# Patient Record
Sex: Female | Born: 1985
Health system: Southern US, Community
[De-identification: ages and names within clinical notes are randomized; demographics above are authoritative.]

## PROBLEM LIST (undated history)

## (undated) DIAGNOSIS — Z202 Contact with and (suspected) exposure to infections with a predominantly sexual mode of transmission: Secondary | ICD-10-CM

## (undated) DIAGNOSIS — O0930 Supervision of pregnancy with insufficient antenatal care, unspecified trimester: Secondary | ICD-10-CM

## (undated) DIAGNOSIS — IMO0002 Reserved for concepts with insufficient information to code with codable children: Secondary | ICD-10-CM

## (undated) DIAGNOSIS — Z789 Other specified health status: Secondary | ICD-10-CM

## (undated) DIAGNOSIS — A749 Chlamydial infection, unspecified: Secondary | ICD-10-CM

## (undated) DIAGNOSIS — D696 Thrombocytopenia, unspecified: Secondary | ICD-10-CM

## (undated) HISTORY — DX: Reserved for concepts with insufficient information to code with codable children: IMO0002

## (undated) HISTORY — DX: Thrombocytopenia, unspecified: D69.6

## (undated) HISTORY — PX: NO PAST SURGERIES: SHX2092

## (undated) HISTORY — DX: Chlamydial infection, unspecified: A74.9

## (undated) HISTORY — DX: Contact with and (suspected) exposure to infections with a predominantly sexual mode of transmission: Z20.2

## (undated) HISTORY — DX: Supervision of pregnancy with insufficient antenatal care, unspecified trimester: O09.30

---
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
## 2007-01-15 ENCOUNTER — Ambulatory Visit: Payer: Self-pay | Admitting: Family Medicine

## 2007-01-15 ENCOUNTER — Encounter (INDEPENDENT_AMBULATORY_CARE_PROVIDER_SITE_OTHER): Payer: Self-pay | Admitting: Family Medicine

## 2007-01-15 LAB — CONVERTED CEMR LAB
Chlamydia, DNA Probe: NEGATIVE
Glucose, Urine, Semiquant: NEGATIVE
Ketones, urine, test strip: NEGATIVE
Nitrite: NEGATIVE
Specific Gravity, Urine: 1.025
WBC Urine, dipstick: NEGATIVE
pH: 5

## 2007-01-22 ENCOUNTER — Encounter (INDEPENDENT_AMBULATORY_CARE_PROVIDER_SITE_OTHER): Payer: Self-pay | Admitting: Family Medicine

## 2007-02-20 ENCOUNTER — Ambulatory Visit: Payer: Self-pay | Admitting: Family Medicine

## 2007-03-30 ENCOUNTER — Ambulatory Visit: Payer: Self-pay | Admitting: Obstetrics & Gynecology

## 2007-05-14 ENCOUNTER — Ambulatory Visit: Payer: Self-pay | Admitting: Family Medicine

## 2007-07-16 ENCOUNTER — Encounter (INDEPENDENT_AMBULATORY_CARE_PROVIDER_SITE_OTHER): Payer: Self-pay | Admitting: Family Medicine

## 2010-12-22 ENCOUNTER — Other Ambulatory Visit (HOSPITAL_COMMUNITY): Payer: Self-pay | Admitting: Obstetrics and Gynecology

## 2010-12-22 ENCOUNTER — Ambulatory Visit (HOSPITAL_COMMUNITY)
Admission: RE | Admit: 2010-12-22 | Discharge: 2010-12-22 | Disposition: A | Discharge status: Home / Self Care | Payer: Medicaid Other | Source: Ambulatory Visit | Attending: Obstetrics and Gynecology | Admitting: Obstetrics and Gynecology

## 2010-12-22 DIAGNOSIS — Z3689 Encounter for other specified antenatal screening: Secondary | ICD-10-CM

## 2010-12-22 DIAGNOSIS — Z1389 Encounter for screening for other disorder: Secondary | ICD-10-CM | POA: Insufficient documentation

## 2010-12-22 DIAGNOSIS — Z363 Encounter for antenatal screening for malformations: Secondary | ICD-10-CM | POA: Insufficient documentation

## 2010-12-22 DIAGNOSIS — O209 Hemorrhage in early pregnancy, unspecified: Secondary | ICD-10-CM | POA: Insufficient documentation

## 2010-12-22 DIAGNOSIS — O358XX0 Maternal care for other (suspected) fetal abnormality and damage, not applicable or unspecified: Secondary | ICD-10-CM | POA: Insufficient documentation

## 2010-12-22 LAB — OB RESULTS CONSOLE ABO/RH: RH Type: POSITIVE

## 2010-12-22 LAB — OB RESULTS CONSOLE HEPATITIS B SURFACE ANTIGEN: Hepatitis B Surface Ag: NEGATIVE

## 2010-12-22 LAB — OB RESULTS CONSOLE RUBELLA ANTIBODY, IGM: Rubella: IMMUNE

## 2010-12-22 LAB — OB RESULTS CONSOLE HIV ANTIBODY (ROUTINE TESTING): HIV: NONREACTIVE

## 2010-12-27 LAB — CBC
HCT: 34 % — AB (ref 36–46)
Hemoglobin: 12.5 g/dL (ref 12.0–16.0)
Platelets: 161 10*3/uL (ref 150–399)

## 2010-12-27 LAB — OB RESULTS CONSOLE HIV ANTIBODY (ROUTINE TESTING): HIV: NONREACTIVE

## 2010-12-27 LAB — OB RESULTS CONSOLE RPR: RPR: NONREACTIVE

## 2011-01-08 DIAGNOSIS — Z202 Contact with and (suspected) exposure to infections with a predominantly sexual mode of transmission: Secondary | ICD-10-CM

## 2011-01-08 HISTORY — DX: Contact with and (suspected) exposure to infections with a predominantly sexual mode of transmission: Z20.2

## 2011-01-22 ENCOUNTER — Other Ambulatory Visit: Payer: Self-pay

## 2011-01-22 ENCOUNTER — Emergency Department (HOSPITAL_COMMUNITY)
Admission: EM | Admit: 2011-01-22 | Discharge: 2011-01-22 | Disposition: A | Discharge status: Home / Self Care | Payer: Medicaid Other | Attending: Emergency Medicine | Admitting: Emergency Medicine

## 2011-01-22 ENCOUNTER — Encounter: Payer: Self-pay | Admitting: *Deleted

## 2011-01-22 ENCOUNTER — Emergency Department (HOSPITAL_COMMUNITY): Payer: Medicaid Other

## 2011-01-22 DIAGNOSIS — R059 Cough, unspecified: Secondary | ICD-10-CM | POA: Insufficient documentation

## 2011-01-22 DIAGNOSIS — R000 Tachycardia, unspecified: Secondary | ICD-10-CM | POA: Insufficient documentation

## 2011-01-22 DIAGNOSIS — R0602 Shortness of breath: Secondary | ICD-10-CM | POA: Insufficient documentation

## 2011-01-22 DIAGNOSIS — R05 Cough: Secondary | ICD-10-CM | POA: Insufficient documentation

## 2011-01-22 DIAGNOSIS — O99891 Other specified diseases and conditions complicating pregnancy: Secondary | ICD-10-CM | POA: Insufficient documentation

## 2011-01-22 DIAGNOSIS — M549 Dorsalgia, unspecified: Secondary | ICD-10-CM | POA: Insufficient documentation

## 2011-01-22 DIAGNOSIS — R10816 Epigastric abdominal tenderness: Secondary | ICD-10-CM | POA: Insufficient documentation

## 2011-01-22 HISTORY — DX: Other specified health status: Z78.9

## 2011-01-22 LAB — BASIC METABOLIC PANEL
GFR calc Af Amer: >90 mL/min (ref >90)
GFR calc non Af Amer: >90 mL/min (ref >90)
Glucose, Bld: 98 mg/dL (ref 70–99)
Potassium: 3.8 mEq/L (ref 3.5–5.1)
Sodium: 133 mEq/L — ABNORMAL LOW (ref 135–145)

## 2011-01-22 LAB — URINALYSIS, ROUTINE W REFLEX MICROSCOPIC
Ketones, ur: 40 mg/dL — AB
Nitrite: NEGATIVE
Protein, ur: NEGATIVE mg/dL

## 2011-01-22 LAB — CBC
MCH: 31.8 pg (ref 26.0–34.0)
Platelets: 122 10*3/uL — ABNORMAL LOW (ref 150–400)
RBC: 3.58 MIL/uL — ABNORMAL LOW (ref 3.87–5.11)
RDW: 13.4 % (ref 11.5–15.5)
WBC: 5.2 10*3/uL (ref 4.0–10.5)

## 2011-01-22 LAB — DIFFERENTIAL
Basophils Absolute: 0 10*3/uL (ref 0.0–0.1)
Eosinophils Absolute: 0 10*3/uL (ref 0.0–0.7)
Lymphocytes Relative: 11 % — ABNORMAL LOW (ref 12–46)
Lymphs Abs: 0.6 10*3/uL — ABNORMAL LOW (ref 0.7–4.0)
Neutrophils Relative %: 77 % (ref 43–77)

## 2011-01-22 LAB — URINE MICROSCOPIC-ADD ON

## 2011-01-22 MED ORDER — IOHEXOL 350 MG/ML SOLN
100.0000 mL | Freq: Once | INTRAVENOUS | Status: AC | PRN
  Administered 2011-01-22: 100 mL via INTRAVENOUS

## 2011-01-22 MED ORDER — BENZONATATE 100 MG PO CAPS: 100.0000 mg | ORAL_CAPSULE | Freq: Three times a day (TID) | ORAL | Status: AC

## 2011-01-22 NOTE — ED Notes (Signed)
Pt returned from CT testing, family at bedside. Pt denies any shortness of breath or chest pains at this time. Pt does report having lower back pain 8/10, achy pain. Pt denies any vaginal bleeding or discharge at this time. Pt INAD, skin w/d, resp e/u and A/O x3 at this time. Plan of care is updated with verbal understanding. Pt is awaiting CT results for disposition of further care. Will continue to monitor pt .

## 2011-01-22 NOTE — Progress Notes (Signed)
Spoke with Elsie Ra, CNM and gave update on pt status and reason for coming to ED.  Pt not having any OB related symptoms.  Lab & CT results reviewed.  Pt ok to d/c home w/ follow up with OB and no further OB interventions necessary before d/c. Everlean Patterson ,RNC

## 2011-01-22 NOTE — ED Provider Notes (Signed)
History     CSN: [id number] Arrival date & time: 01/22/2011  4:42 PM   First MD Initiated Contact with Patient 01/22/11 1659      Chief Complaint  Patient presents with  . Back Pain    (Consider location/radiation/quality/duration/timing/severity/associated sxs/prior treatment) HPI Comments: Patient is [redacted] weeks pregnant presenting with upper back pain shortness of breath been going on for the past 2 days. She states she has worsening pain with deep breathing and trouble breathing because of the pain. She denies any injury, weakness, numbness or tingling. She denies any chest pain, fever. She has had a dry cough. She denies abdominal pain, vaginal bleeding or discharge. She's been taking Mucinex without relief. Denies any leg pain or swelling. She arrives tachycardic, and mildly tachypneic.  The history is provided by the patient.    Past Medical History  Diagnosis Date  . No pertinent past medical history     Past Surgical History  Procedure Date  . No past surgeries     No family history on file.  History  Substance Use Topics  . Smoking status: Never Smoker   . Smokeless tobacco: Not on file  . Alcohol Use: No    OB History    Grav Para Term Preterm Abortions TAB SAB Ect Mult Living   1               Review of Systems  Constitutional: Negative for activity change and appetite change.  HENT: Negative for congestion and rhinorrhea.   Eyes: Negative for visual disturbance.  Respiratory: Positive for cough and shortness of breath. Negative for chest tightness.   Cardiovascular: Negative for chest pain and palpitations.  Gastrointestinal: Negative for nausea, vomiting and abdominal pain.  Genitourinary: Negative for dysuria and hematuria.  Musculoskeletal: Positive for back pain.  Skin: Negative for rash.  Neurological: Negative for dizziness, weakness and headaches.    Allergies  Review of patient's allergies indicates no known allergies.  Home Medications     Current Outpatient Rx  Name Route Sig Dispense Refill  . GUAIFENESIN 100 MG/5ML PO LIQD Oral Take 200 mg by mouth 3 (three) times daily as needed. For cough/congestion     . BENZONATATE 100 MG PO CAPS Oral Take 1 capsule (100 mg total) by mouth every 8 (eight) hours. 21 capsule 0    BP 94/64  Pulse 111  Temp(Src) 99.9 F (37.7 C) (Oral)  Resp 19  SpO2 100%  Physical Exam  Constitutional: She is oriented to person, place, and time. She appears well-developed. No distress.  HENT:  Head: Normocephalic and atraumatic.  Mouth/Throat: Oropharynx is clear and moist. No oropharyngeal exudate.  Eyes: Conjunctivae are normal. Pupils are equal, round, and reactive to light.  Neck: Normal range of motion. Neck supple.  Cardiovascular: Normal rate, regular rhythm and normal heart sounds.        Tachycardic  Pulmonary/Chest: Effort normal and breath sounds normal. No respiratory distress. She has no wheezes.  Abdominal: Soft. There is no tenderness. There is no rebound and no guarding.       Gravid with minimal epigastric tenderness  Musculoskeletal: Normal range of motion. She exhibits tenderness. She exhibits no edema.       No calf tenderness  Neurological: She is alert and oriented to person, place, and time. No cranial nerve deficit.  Skin: Skin is warm.    ED Course  Procedures (including critical care time)  Labs Reviewed  URINALYSIS, ROUTINE W REFLEX MICROSCOPIC - Abnormal; Notable  for the following:    Color, Urine AMBER (*) BIOCHEMICALS MAY BE AFFECTED BY COLOR   Bilirubin Urine SMALL (*)    Ketones, ur 40 (*)    Leukocytes, UA TRACE (*)    All other components within normal limits  CBC - Abnormal; Notable for the following:    RBC 3.58 (*)    Hemoglobin 11.4 (*)    HCT 32.0 (*)    Platelets 122 (*)    All other components within normal limits  DIFFERENTIAL - Abnormal; Notable for the following:    Lymphocytes Relative 11 (*)    Lymphs Abs 0.6 (*)    All other  components within normal limits  BASIC METABOLIC PANEL - Abnormal; Notable for the following:    Sodium 133 (*)    All other components within normal limits  URINE MICROSCOPIC-ADD ON - Abnormal; Notable for the following:    Squamous Epithelial / LPF MANY (*)    All other components within normal limits   Ct Angio Chest W/cm &/or Wo Cm  01/22/2011  *RADIOLOGY REPORT*  Clinical Data:  Short of breath.  Pregnant  CT ANGIOGRAPHY CHEST WITH CONTRAST  Technique:  Multidetector CT imaging of the chest was performed using the standard protocol during bolus administration of intravenous contrast.  Multiplanar CT image reconstructions including MIPs were obtained to evaluate the vascular anatomy.  Contrast: OMNIPAQUE IOHEXOL 350 MG/ML IV SOLN  Comparison:  None  Findings:  The patient is pregnant and was shielded for the scan.  Negative for pulmonary embolism.  Thoracic aorta is normal.  The lungs are clear without infiltrate or effusion.  No mass or adenopathy is detected.  Review of the MIP images confirms the above findings.  IMPRESSION: Normal  Original Report Authenticated By: Camelia Phenes, M.D.     1. Back pain   2. Cough       MDM  [redacted] weeks pregnant with upper back pain, shortness of breath, tachycardia and cough.  Lungs clear to auscultation.   There is concern for pulmonary embolism given patient's gravid status, tachycardia, and respiratory symptoms.    Risks and benefits of CT angiogram discussed with patient and she agrees to proceed.    Date: 01/22/2011  Rate: 106  Rhythm: sinus tachycardia  QRS Axis: normal  Intervals: normal  ST/T Wave abnormalities: normal  Conduction Disutrbances:none  Narrative Interpretation:   Old EKG Reviewed: none available   CT scan negative for pulmonary embolism. Case discussed with OB rapid response nurse  who discussed the case with on-call center [facility] obstetrics.  patient stable for discharge as she is at 21 weeks and nonviable.    We will treat her cough with tessalon pearls and she can follow up with her OB doctoer.  Glynn Octave, MD 01/23/11 [phone]

## 2011-01-22 NOTE — ED Notes (Signed)
Report received from [facility] RN, pt in CT testing at this time. 1st contact with pt, will continue to monitor pt.

## 2011-01-22 NOTE — ED Notes (Signed)
Pt denies any questions upon discharge. 

## 2011-01-22 NOTE — ED Notes (Signed)
Patient with back pain for about two days.  She states that pain is making her experiencing difficulty breathing.  Respiratory is intact at triage

## 2011-01-22 NOTE — ED Notes (Signed)
OB Rn at bedside with pt.

## 2011-02-08 NOTE — L&D Delivery Note (Signed)
Cesarean Section Procedure Note  Indications: failure to progress: arrest of dilation  Pre-operative Diagnosis: 40 week 0 day pregnancy.  Post-operative Diagnosis: same  Surgeon: Purcell Nails   Assistants: Lavera Guise, CNM  Anesthesia: Epidural anesthesia  Procedure Details   The patient was seen in the Holding Room. The risks, benefits, complications, treatment options, and expected outcomes were discussed with the patient.  The patient concurred with the proposed plan, giving informed consent.  The site of surgery properly noted/marked. The patient was taken to Operating Room # 1, identified as Joanna Shaw and the procedure verified as C-Section Delivery. A Time Out was held and the above information confirmed.  After induction of anesthesia, the patient was draped and prepped in the usual sterile manner. A Pfannenstiel incision was made and carried down through the subcutaneous tissue to the fascia. Fascial incision was made and extended transversely. The fascia was separated from the underlying rectus tissue superiorly and inferiorly. The peritoneum was identified and entered. Peritoneal incision was extended longitudinally. The utero-vesical peritoneal reflection was incised transversely and the bladder flap was bluntly freed from the lower uterine segment. A low transverse uterine incision was made. Infant was delivered from cephalic, DOP presentation with Apgar scores of 8 at one minute and 9 at five minutes. After the umbilical cord was clamped and cut cord blood was obtained for evaluation. The placenta was removed intact and appeared normal. The uterine outline, tubes and ovaries appeared normal. The uterine incision was closed with running locked sutures of 0 Vicryl.  A second imbricating layer was performed. Hemostasis was observed. Lavage was carried out until clear. The fascia was then reapproximated with running sutures of 0 Vicryl. SQ was reapproximated with 2-0  plain.  The skin was reapproximated with 3.0 and Monocryl.  Instrument, sponge, and needle counts were correct prior the abdominal closure and at the conclusion of the case.   Findings: Normal bilateral ovaries and tubes  Estimated Blood Loss:  700 cc         Drains: foley to gravity 250 cc         Total IV Fluids:  1000 ml         Specimens: Placenta         Complications:  None; patient tolerated the procedure well.         Disposition: PACU - hemodynamically stable.         Condition: stable  Attending Attestation: I was present and scrubbed for the entire procedure.

## 2011-04-06 ENCOUNTER — Encounter (INDEPENDENT_AMBULATORY_CARE_PROVIDER_SITE_OTHER): Payer: Medicaid Other | Admitting: Obstetrics and Gynecology

## 2011-04-06 DIAGNOSIS — Z331 Pregnant state, incidental: Secondary | ICD-10-CM

## 2011-04-20 ENCOUNTER — Encounter (INDEPENDENT_AMBULATORY_CARE_PROVIDER_SITE_OTHER): Payer: Medicaid Other | Admitting: Registered Nurse

## 2011-04-20 DIAGNOSIS — Z331 Pregnant state, incidental: Secondary | ICD-10-CM

## 2011-05-04 ENCOUNTER — Encounter (INDEPENDENT_AMBULATORY_CARE_PROVIDER_SITE_OTHER): Payer: Medicaid Other | Admitting: Obstetrics and Gynecology

## 2011-05-04 DIAGNOSIS — Z3480 Encounter for supervision of other normal pregnancy, unspecified trimester: Secondary | ICD-10-CM

## 2011-05-11 ENCOUNTER — Encounter (INDEPENDENT_AMBULATORY_CARE_PROVIDER_SITE_OTHER): Payer: Medicaid Other | Admitting: Obstetrics and Gynecology

## 2011-05-11 DIAGNOSIS — Z331 Pregnant state, incidental: Secondary | ICD-10-CM

## 2011-05-16 ENCOUNTER — Encounter: Payer: Medicaid Other | Admitting: Obstetrics and Gynecology

## 2011-05-17 DIAGNOSIS — D696 Thrombocytopenia, unspecified: Secondary | ICD-10-CM

## 2011-05-17 DIAGNOSIS — O0930 Supervision of pregnancy with insufficient antenatal care, unspecified trimester: Secondary | ICD-10-CM | POA: Insufficient documentation

## 2011-05-18 ENCOUNTER — Ambulatory Visit (INDEPENDENT_AMBULATORY_CARE_PROVIDER_SITE_OTHER): Payer: Medicaid Other | Admitting: Obstetrics and Gynecology

## 2011-05-18 ENCOUNTER — Encounter: Payer: Self-pay | Admitting: Obstetrics and Gynecology

## 2011-05-18 VITALS — BP 108/68 | Wt 155.0 lb

## 2011-05-18 DIAGNOSIS — Z202 Contact with and (suspected) exposure to infections with a predominantly sexual mode of transmission: Secondary | ICD-10-CM | POA: Insufficient documentation

## 2011-05-18 DIAGNOSIS — O26899 Other specified pregnancy related conditions, unspecified trimester: Secondary | ICD-10-CM

## 2011-05-18 DIAGNOSIS — R8789 Other abnormal findings in specimens from female genital organs: Secondary | ICD-10-CM

## 2011-05-18 DIAGNOSIS — B977 Papillomavirus as the cause of diseases classified elsewhere: Secondary | ICD-10-CM

## 2011-05-18 DIAGNOSIS — M545 Low back pain: Secondary | ICD-10-CM

## 2011-05-18 DIAGNOSIS — O0930 Supervision of pregnancy with insufficient antenatal care, unspecified trimester: Secondary | ICD-10-CM | POA: Insufficient documentation

## 2011-05-18 DIAGNOSIS — N898 Other specified noninflammatory disorders of vagina: Secondary | ICD-10-CM

## 2011-05-18 DIAGNOSIS — D696 Thrombocytopenia, unspecified: Secondary | ICD-10-CM | POA: Insufficient documentation

## 2011-05-18 LAB — POCT URINALYSIS DIP (MANUAL ENTRY)
Glucose, UA: NEGATIVE
Nitrite, UA: NEGATIVE
Protein Ur, POC: NEGATIVE
Spec Grav, UA: 1.025
Urobilinogen, UA: 1
pH, UA: 5

## 2011-05-18 LAB — POCT WET PREP (WET MOUNT)
Clue Cells Wet Prep Whiff POC: NEGATIVE
Trichomonas Wet Prep HPF POC: NEGATIVE

## 2011-05-18 MED ORDER — CLOTRIMAZOLE-BETAMETHASONE 1-0.05 % EX CREA: TOPICAL_CREAM | Freq: Every day | CUTANEOUS | Status: DC

## 2011-05-18 NOTE — Progress Notes (Signed)
2+ leuks , urobilinogen=1 trace blood. All else nl on long dip UA. Pt states abnl d/c with itching.Marland KitchenMarland KitchenYellowish d/c. Alvino Chapel

## 2011-05-18 NOTE — Progress Notes (Signed)
No complaints except ?yeast infection but denies LOF Spec yellowish d/c Wet prep ph 5.0, neg whiff, neg Reviewed GBS neg  Lotrisone secondary to vulvitis FKCs and Labor Precautions RTO 1 wk

## 2011-05-25 ENCOUNTER — Encounter: Payer: Self-pay | Admitting: Obstetrics and Gynecology

## 2011-05-25 ENCOUNTER — Ambulatory Visit (INDEPENDENT_AMBULATORY_CARE_PROVIDER_SITE_OTHER): Payer: Medicaid Other | Admitting: Obstetrics and Gynecology

## 2011-05-25 VITALS — BP 104/64 | Ht 67.0 in | Wt 159.0 lb

## 2011-05-25 DIAGNOSIS — Z3400 Encounter for supervision of normal first pregnancy, unspecified trimester: Secondary | ICD-10-CM

## 2011-05-25 DIAGNOSIS — N949 Unspecified condition associated with female genital organs and menstrual cycle: Secondary | ICD-10-CM

## 2011-05-25 LAB — POCT URINALYSIS DIPSTICK
Glucose, UA: NEGATIVE
Ketones, UA: NEGATIVE
Spec Grav, UA: 1.01

## 2011-05-25 NOTE — Patient Instructions (Signed)
Letter for pt to begin maternity leave today

## 2011-05-25 NOTE — Progress Notes (Signed)
Pt. Stated having some pain/pressure in vagina area. Pt want cervix check today .

## 2011-05-26 ENCOUNTER — Encounter: Payer: Self-pay | Admitting: Obstetrics and Gynecology

## 2011-05-26 NOTE — Progress Notes (Signed)
Doing well 

## 2011-05-29 ENCOUNTER — Inpatient Hospital Stay (HOSPITAL_COMMUNITY)
Admission: AD | Admit: 2011-05-29 | Discharge: 2011-06-01 | DRG: 766 | Disposition: A | Discharge status: Home / Self Care | Payer: Medicaid Other | Source: Ambulatory Visit | Attending: Obstetrics and Gynecology | Admitting: Obstetrics and Gynecology

## 2011-05-29 ENCOUNTER — Encounter (HOSPITAL_COMMUNITY): Payer: Self-pay | Admitting: Anesthesiology

## 2011-05-29 ENCOUNTER — Inpatient Hospital Stay (HOSPITAL_COMMUNITY): Payer: Medicaid Other | Admitting: Anesthesiology

## 2011-05-29 ENCOUNTER — Encounter (HOSPITAL_COMMUNITY): Payer: Self-pay | Admitting: *Deleted

## 2011-05-29 ENCOUNTER — Encounter (HOSPITAL_COMMUNITY)
Admission: AD | Disposition: A | Discharge status: Home / Self Care | Payer: Self-pay | Source: Ambulatory Visit | Attending: Obstetrics and Gynecology

## 2011-05-29 DIAGNOSIS — IMO0001 Reserved for inherently not codable concepts without codable children: Secondary | ICD-10-CM

## 2011-05-29 DIAGNOSIS — O9903 Anemia complicating the puerperium: Secondary | ICD-10-CM | POA: Diagnosis not present

## 2011-05-29 DIAGNOSIS — D649 Anemia, unspecified: Secondary | ICD-10-CM | POA: Diagnosis not present

## 2011-05-29 DIAGNOSIS — O324XX0 Maternal care for high head at term, not applicable or unspecified: Secondary | ICD-10-CM

## 2011-05-29 LAB — CBC
HCT: 28.2 % — ABNORMAL LOW (ref 36.0–46.0)
Hemoglobin: 10.4 g/dL — ABNORMAL LOW (ref 12.0–15.0)
MCH: 28.2 pg (ref 26.0–34.0)
MCHC: 33.3 g/dL (ref 30.0–36.0)
MCV: 85.7 fL (ref 78.0–100.0)
Platelets: 129 10*3/uL — ABNORMAL LOW (ref 150–400)
RBC: 3.69 MIL/uL — ABNORMAL LOW (ref 3.87–5.11)
RDW: 14.2 % (ref 11.5–15.5)
WBC: 5.6 10*3/uL (ref 4.0–10.5)

## 2011-05-29 LAB — RPR: RPR Ser Ql: NONREACTIVE

## 2011-05-29 SURGERY — Surgical Case
Anesthesia: Epidural | Site: Abdomen | Wound class: Clean Contaminated

## 2011-05-29 MED ORDER — LACTATED RINGERS IV SOLN
20.0000 [IU] | INTRAVENOUS | Status: DC | PRN
  Administered 2011-05-29: 20 [IU] via INTRAVENOUS

## 2011-05-29 MED ORDER — CITRIC ACID-SODIUM CITRATE 334-500 MG/5ML PO SOLN
30.0000 mL | ORAL | Status: DC | PRN
  Administered 2011-05-29: 30 mL via ORAL
  Filled 2011-05-29: qty 15

## 2011-05-29 MED ORDER — EPHEDRINE 5 MG/ML INJ
10.0000 mg | INTRAVENOUS | Status: DC | PRN
  Filled 2011-05-29: qty 4

## 2011-05-29 MED ORDER — FENTANYL CITRATE 0.05 MG/ML IJ SOLN
INTRAMUSCULAR | Status: AC
  Filled 2011-05-29: qty 2

## 2011-05-29 MED ORDER — TERBUTALINE SULFATE 1 MG/ML IJ SOLN: 0.2500 mg | Freq: Once | INTRAMUSCULAR | Status: DC | PRN

## 2011-05-29 MED ORDER — OXYTOCIN 20 UNITS IN LACTATED RINGERS INFUSION - SIMPLE: 1.0000 m[IU]/min | INTRAVENOUS | Status: DC

## 2011-05-29 MED ORDER — KETOROLAC TROMETHAMINE 60 MG/2ML IM SOLN: 60.0000 mg | Freq: Once | INTRAMUSCULAR | Status: DC | PRN

## 2011-05-29 MED ORDER — KETOROLAC TROMETHAMINE 30 MG/ML IJ SOLN: 30.0000 mg | Freq: Four times a day (QID) | INTRAMUSCULAR | Status: DC | PRN

## 2011-05-29 MED ORDER — LACTATED RINGERS IV SOLN
INTRAVENOUS | Status: DC | PRN
  Administered 2011-05-29 (×2): via INTRAVENOUS

## 2011-05-29 MED ORDER — MORPHINE SULFATE (PF) 0.5 MG/ML IJ SOLN
INTRAMUSCULAR | Status: DC | PRN
  Administered 2011-05-29: 1 mg via INTRAVENOUS

## 2011-05-29 MED ORDER — MORPHINE SULFATE 0.5 MG/ML IJ SOLN
INTRAMUSCULAR | Status: AC
  Filled 2011-05-29: qty 10

## 2011-05-29 MED ORDER — LIDOCAINE HCL (PF) 1 % IJ SOLN
INTRAMUSCULAR | Status: DC | PRN
  Administered 2011-05-29 (×3): 4 mL

## 2011-05-29 MED ORDER — PHENYLEPHRINE 40 MCG/ML (10ML) SYRINGE FOR IV PUSH (FOR BLOOD PRESSURE SUPPORT)
80.0000 ug | PREFILLED_SYRINGE | INTRAVENOUS | Status: DC | PRN
  Filled 2011-05-29: qty 5

## 2011-05-29 MED ORDER — LIDOCAINE HCL (PF) 1 % IJ SOLN: 30.0000 mL | INTRAMUSCULAR | Status: DC | PRN

## 2011-05-29 MED ORDER — ONDANSETRON HCL 4 MG/2ML IJ SOLN
4.0000 mg | Freq: Four times a day (QID) | INTRAMUSCULAR | Status: DC | PRN
  Administered 2011-05-29: 4 mg via INTRAVENOUS
  Filled 2011-05-29: qty 2

## 2011-05-29 MED ORDER — HYDROXYZINE HCL 50 MG/ML IM SOLN: 50.0000 mg | Freq: Four times a day (QID) | INTRAMUSCULAR | Status: DC | PRN

## 2011-05-29 MED ORDER — ONDANSETRON HCL 4 MG/2ML IJ SOLN
INTRAMUSCULAR | Status: DC | PRN
  Administered 2011-05-29: 4 mg via INTRAVENOUS

## 2011-05-29 MED ORDER — DIPHENHYDRAMINE HCL 50 MG/ML IJ SOLN: 12.5000 mg | INTRAMUSCULAR | Status: DC | PRN

## 2011-05-29 MED ORDER — OXYTOCIN 20 UNITS IN LACTATED RINGERS INFUSION - SIMPLE
1.0000 m[IU]/min | INTRAVENOUS | Status: DC
  Administered 2011-05-29: 3 m[IU]/min via INTRAVENOUS
  Administered 2011-05-29: 2 m[IU]/min via INTRAVENOUS
  Filled 2011-05-29: qty 1000

## 2011-05-29 MED ORDER — FENTANYL CITRATE 0.05 MG/ML IJ SOLN
25.0000 ug | INTRAMUSCULAR | Status: DC | PRN
  Administered 2011-05-29 (×2): 25 ug via INTRAVENOUS

## 2011-05-29 MED ORDER — IBUPROFEN 600 MG PO TABS: 600.0000 mg | ORAL_TABLET | Freq: Four times a day (QID) | ORAL | Status: DC | PRN

## 2011-05-29 MED ORDER — LACTATED RINGERS IV SOLN: 500.0000 mL | Freq: Once | INTRAVENOUS | Status: DC

## 2011-05-29 MED ORDER — OXYTOCIN 20 UNITS IN LACTATED RINGERS INFUSION - SIMPLE: 125.0000 mL/h | Freq: Once | INTRAVENOUS | Status: DC

## 2011-05-29 MED ORDER — MEPERIDINE HCL 25 MG/ML IJ SOLN
INTRAMUSCULAR | Status: AC
  Filled 2011-05-29: qty 1

## 2011-05-29 MED ORDER — MORPHINE SULFATE (PF) 0.5 MG/ML IJ SOLN
INTRAMUSCULAR | Status: DC | PRN
  Administered 2011-05-29: 4 mg via EPIDURAL

## 2011-05-29 MED ORDER — BUTORPHANOL TARTRATE 2 MG/ML IJ SOLN: 1.0000 mg | INTRAMUSCULAR | Status: DC | PRN

## 2011-05-29 MED ORDER — SCOPOLAMINE 1 MG/3DAYS TD PT72
1.0000 | MEDICATED_PATCH | Freq: Once | TRANSDERMAL | Status: DC
  Administered 2011-05-29: 1.5 mg via TRANSDERMAL

## 2011-05-29 MED ORDER — OXYCODONE-ACETAMINOPHEN 5-325 MG PO TABS: 1.0000 | ORAL_TABLET | ORAL | Status: DC | PRN

## 2011-05-29 MED ORDER — EPHEDRINE 5 MG/ML INJ: 10.0000 mg | INTRAVENOUS | Status: DC | PRN

## 2011-05-29 MED ORDER — FENTANYL 2.5 MCG/ML BUPIVACAINE 1/10 % EPIDURAL INFUSION (WH - ANES)
14.0000 mL/h | INTRAMUSCULAR | Status: DC
  Administered 2011-05-29 (×4): 14 mL/h via EPIDURAL
  Filled 2011-05-29 (×4): qty 60

## 2011-05-29 MED ORDER — LACTATED RINGERS IV SOLN
INTRAVENOUS | Status: DC
  Administered 2011-05-29: 200 mL via INTRAVENOUS
  Administered 2011-05-29 (×2): via INTRAVENOUS

## 2011-05-29 MED ORDER — LACTATED RINGERS IV SOLN
500.0000 mL | INTRAVENOUS | Status: DC | PRN
  Administered 2011-05-29: 1000 mL via INTRAVENOUS

## 2011-05-29 MED ORDER — FENTANYL CITRATE 0.05 MG/ML IJ SOLN
INTRAMUSCULAR | Status: AC
  Administered 2011-05-29: 25 ug via INTRAVENOUS
  Filled 2011-05-29: qty 2

## 2011-05-29 MED ORDER — ACETAMINOPHEN 325 MG PO TABS: 650.0000 mg | ORAL_TABLET | ORAL | Status: DC | PRN

## 2011-05-29 MED ORDER — MEPERIDINE HCL 25 MG/ML IJ SOLN
INTRAMUSCULAR | Status: DC | PRN
  Administered 2011-05-29: 25 mg via INTRAVENOUS

## 2011-05-29 MED ORDER — ONDANSETRON HCL 4 MG/2ML IJ SOLN
INTRAMUSCULAR | Status: AC
  Filled 2011-05-29: qty 2

## 2011-05-29 MED ORDER — SODIUM BICARBONATE 8.4 % IV SOLN
INTRAVENOUS | Status: DC | PRN
  Administered 2011-05-29: 5 mL via EPIDURAL

## 2011-05-29 MED ORDER — 0.9 % SODIUM CHLORIDE (POUR BTL) OPTIME
TOPICAL | Status: DC | PRN
  Administered 2011-05-29 (×2): 200 mL

## 2011-05-29 MED ORDER — SCOPOLAMINE 1 MG/3DAYS TD PT72
MEDICATED_PATCH | TRANSDERMAL | Status: AC
  Filled 2011-05-29: qty 1

## 2011-05-29 MED ORDER — FENTANYL CITRATE 0.05 MG/ML IJ SOLN
INTRAMUSCULAR | Status: DC | PRN
  Administered 2011-05-29 (×2): 50 ug via INTRAVENOUS

## 2011-05-29 MED ORDER — PHENYLEPHRINE 40 MCG/ML (10ML) SYRINGE FOR IV PUSH (FOR BLOOD PRESSURE SUPPORT): 80.0000 ug | PREFILLED_SYRINGE | INTRAVENOUS | Status: DC | PRN

## 2011-05-29 MED ORDER — PHENYLEPHRINE HCL 10 MG/ML IJ SOLN
INTRAMUSCULAR | Status: DC | PRN
  Administered 2011-05-29: 40 ug via INTRAVENOUS
  Administered 2011-05-29: 80 ug via INTRAVENOUS
  Administered 2011-05-29: 40 ug via INTRAVENOUS
  Administered 2011-05-29 (×3): 80 ug via INTRAVENOUS

## 2011-05-29 MED ORDER — MEPERIDINE HCL 25 MG/ML IJ SOLN: 6.2500 mg | INTRAMUSCULAR | Status: DC | PRN

## 2011-05-29 MED ORDER — PHENYLEPHRINE 40 MCG/ML (10ML) SYRINGE FOR IV PUSH (FOR BLOOD PRESSURE SUPPORT)
PREFILLED_SYRINGE | INTRAVENOUS | Status: AC
  Filled 2011-05-29: qty 10

## 2011-05-29 MED ORDER — OXYTOCIN BOLUS FROM INFUSION
500.0000 mL | Freq: Once | INTRAVENOUS | Status: DC
  Filled 2011-05-29: qty 500

## 2011-05-29 MED ORDER — FLEET ENEMA 7-19 GM/118ML RE ENEM: 1.0000 | ENEMA | RECTAL | Status: DC | PRN

## 2011-05-29 MED ORDER — OXYTOCIN 10 UNIT/ML IJ SOLN
INTRAMUSCULAR | Status: AC
  Filled 2011-05-29: qty 4

## 2011-05-29 MED ORDER — HYDROXYZINE HCL 50 MG PO TABS
50.0000 mg | ORAL_TABLET | Freq: Four times a day (QID) | ORAL | Status: DC | PRN
  Filled 2011-05-29: qty 1

## 2011-05-29 MED ORDER — CEFAZOLIN SODIUM-DEXTROSE 2-3 GM-% IV SOLR
2.0000 g | Freq: Once | INTRAVENOUS | Status: AC
  Administered 2011-05-29: 2 g via INTRAVENOUS
  Filled 2011-05-29: qty 50

## 2011-05-29 SURGICAL SUPPLY — 36 items
APL SKNCLS STERI-STRIP NONHPOA (GAUZE/BANDAGES/DRESSINGS) ×1
BENZOIN TINCTURE PRP APPL 2/3 (GAUZE/BANDAGES/DRESSINGS) ×2 IMPLANT
CHLORAPREP W/TINT 26ML (MISCELLANEOUS) ×2 IMPLANT
CLOTH BEACON ORANGE TIMEOUT ST (SAFETY) ×2 IMPLANT
CONTAINER PREFILL 10% NBF 15ML (MISCELLANEOUS) IMPLANT
DRESSING TELFA 8X3 (GAUZE/BANDAGES/DRESSINGS) ×1 IMPLANT
ELECT REM PT RETURN 9FT ADLT (ELECTROSURGICAL) ×2
ELECTRODE REM PT RTRN 9FT ADLT (ELECTROSURGICAL) ×1 IMPLANT
EXTRACTOR VACUUM M CUP 4 TUBE (SUCTIONS) IMPLANT
GAUZE SPONGE 4X4 12PLY STRL LF (GAUZE/BANDAGES/DRESSINGS) ×1 IMPLANT
GLOVE BIO SURGEON STRL SZ7.5 (GLOVE) ×4 IMPLANT
GLOVE BIOGEL PI IND STRL 7.5 (GLOVE) ×1 IMPLANT
GLOVE BIOGEL PI INDICATOR 7.5 (GLOVE) ×1
GOWN PREVENTION PLUS LG XLONG (DISPOSABLE) ×6 IMPLANT
KIT ABG SYR 3ML LUER SLIP (SYRINGE) IMPLANT
NDL HYPO 25X5/8 SAFETYGLIDE (NEEDLE) IMPLANT
NEEDLE HYPO 22GX1.5 SAFETY (NEEDLE) IMPLANT
NEEDLE HYPO 25X5/8 SAFETYGLIDE (NEEDLE) IMPLANT
NS IRRIG 1000ML POUR BTL (IV SOLUTION) ×2 IMPLANT
PACK C SECTION WH (CUSTOM PROCEDURE TRAY) ×2 IMPLANT
PAD ABD 7.5X8 STRL (GAUZE/BANDAGES/DRESSINGS) ×1 IMPLANT
RETRACTOR WND ALEXIS 25 LRG (MISCELLANEOUS) ×1 IMPLANT
RTRCTR WOUND ALEXIS 25CM LRG (MISCELLANEOUS) ×2
SLEEVE SCD COMPRESS KNEE MED (MISCELLANEOUS) ×2 IMPLANT
STRIP CLOSURE SKIN 1/2X4 (GAUZE/BANDAGES/DRESSINGS) ×2 IMPLANT
SUT CHROMIC 2 0 CT 1 (SUTURE) ×2 IMPLANT
SUT MNCRL AB 3-0 PS2 27 (SUTURE) ×2 IMPLANT
SUT PLAIN 0 NONE (SUTURE) IMPLANT
SUT PLAIN 2 0 XLH (SUTURE) ×2 IMPLANT
SUT VIC AB 0 CT1 36 (SUTURE) ×2 IMPLANT
SUT VIC AB 0 CTX 36 (SUTURE) ×6
SUT VIC AB 0 CTX36XBRD ANBCTRL (SUTURE) ×3 IMPLANT
SYR CONTROL 10ML LL (SYRINGE) IMPLANT
TOWEL OR 17X24 6PK STRL BLUE (TOWEL DISPOSABLE) ×4 IMPLANT
TRAY FOLEY CATH 14FR (SET/KITS/TRAYS/PACK) IMPLANT
WATER STERILE IRR 1000ML POUR (IV SOLUTION) ×2 IMPLANT

## 2011-05-29 NOTE — Anesthesia Procedure Notes (Signed)

## 2011-05-29 NOTE — Progress Notes (Signed)
Irregularity noted FHR at intervals. No particular pattern

## 2011-05-29 NOTE — Anesthesia Postprocedure Evaluation (Signed)
Anesthesia Post Note  Patient: Joanna Shaw  Procedure(s) Performed: Procedure(s) (LRB): CESAREAN SECTION (N/A)  Anesthesia type: Epidural  Patient location: PACU  Post pain: Pain level controlled  Post assessment: Post-op Vital signs reviewed  Last Vitals:  Filed Vitals:   05/29/11 2230  BP: 111/63  Pulse: 71  Temp: 37.4 C  Resp: 17    Post vital signs: stable  Level of consciousness: awake  Complications: No apparent anesthesia complications

## 2011-05-29 NOTE — Progress Notes (Addendum)
Comfortable, agrees to scalp electrode O VSS T 99     Fhts 150 late decels resolved after position change    abd soft between uc    uc adequate 240 MVU    Vag unchanged A no cervical change with adequate uc, rising baseline P update Dr. Su Hilt now per telephone.  addendum: Dr. Su Hilt updated on pt status as written and pt agrees to cesarean section, discussed risks bleeding, infection, injury to bowel or bladder,  Lavera Guise, CNM

## 2011-05-29 NOTE — Progress Notes (Signed)
Comfortable with epidural O VSS      fhts category 1      abd soft between uc      uc q 2-3 adequate but 1400-1500 coupling at times      Vag 6-7 90 -1 VTX no capit A  Adequate MVU without cervical change P will updated Dr. Su Hilt, repositioned to squat position. Lavera Guise, CNM

## 2011-05-29 NOTE — Progress Notes (Signed)
Discussed 1600 progress note with Dr. Su Hilt, continue care. Joanna Shaw, CNM

## 2011-05-29 NOTE — Progress Notes (Signed)
To 167 via w/c

## 2011-05-29 NOTE — OR Nursing (Signed)
Fundal massage by  DLWegner RN 

## 2011-05-29 NOTE — Anesthesia Preprocedure Evaluation (Addendum)

## 2011-05-29 NOTE — MAU Note (Signed)
ctxs started at 0212. Having some bloody show and watery d/c.

## 2011-05-29 NOTE — MAU Note (Signed)
Eustace Pen CNM discussing plan of care and pain management with pt

## 2011-05-29 NOTE — Progress Notes (Signed)
Joanna Shaw is a 26 y.o. G1P0 at [redacted]w[redacted]d.  Subjective: Comfortable w/ epidural  Objective: BP 109/66  Pulse 81  Temp(Src) 98.4 F (36.9 C) (Oral)  Resp 18  Ht 5\' 6"  (1.676 m)  Wt 73.483 kg (162 lb)  BMI 26.15 kg/m2  LMP 08/22/2010      FHT:  FHR: 130 bpm, variability: moderate,  accelerations:  Present,  decelerations:  Absent UC:   irregular, every 2-7 minutes, moderate to strong SVE:   Dilation: 6 Effacement (%): 90 Station: -1 Exam by:: [employer]: Lab Results  Component Value Date   WBC 5.6 05/29/2011   HGB 10.4* 05/29/2011   HCT 31.7* 05/29/2011   MCV 85.9 05/29/2011   PLT 129* 05/29/2011    Assessment / Plan: Protracted active phase  Labor: Progressing slowly, but w/ inadequate contraction pattern. If minimal change at next exam, will start pitocin and consider IUPC. Preeclampsia:  NA Fetal Wellbeing:  Category I Pain Control:  Epidural I/D:  n/a Anticipated MOD:  NSVD  Thierno Hun 05/29/2011, 7:31 AM

## 2011-05-29 NOTE — Progress Notes (Signed)
Joanna Shaw is a 26 y.o. G1P0 at [redacted]w[redacted]d.  Subjective: abd cramping  Objective: BP 128/79  Pulse 80  Temp(Src) 98.6 F (37 C) (Oral)  Resp 18  Ht 5\' 6"  (1.676 m)  Wt 73.483 kg (162 lb)  BMI 26.15 kg/m2  SpO2 100%  LMP 08/22/2010   Total I/O In: 1000 [Other:1000] Out: -   FHT:  FHR: 130 bpm, variability: moderate,  accelerations:  Present,  decelerations:  Present early decels UC:   regular, every 2-4 minutes on 10 MilliUnits/min pitocin. MVU's 120-240 SVE:   Dilation: 7 Effacement (%): 90 Station: +1 Exam by:: [place]  Labs:  Assessment / Plan: Augmentation of labor, progressing slowly w/borderline inadequate labor  Labor: Progressing slowly in pitocin. Continue to increase pitocin Preeclampsia:  NA Fetal Wellbeing:  Category I Pain Control:  Epidural I/D:  n/a Anticipated MOD:  NSVD Dr. Su Hilt updated. Aware of slow progress, interventions.  Destyne Goodreau 05/29/2011, 2:30 PM

## 2011-05-29 NOTE — Transfer of Care (Signed)
Immediate Anesthesia Transfer of Care Note  Patient: Joanna Shaw  Procedure(s) Performed: Procedure(s) (LRB): CESAREAN SECTION (N/A)  Patient Location: PACU  Anesthesia Type: Epidural  Level of Consciousness: awake, alert  and oriented  Airway & Oxygen Therapy: Patient Spontanous Breathing  Post-op Assessment: Report given to PACU RN and Post -op Vital signs reviewed and stable  Post vital signs: stable  Complications: No apparent anesthesia complications

## 2011-05-29 NOTE — Progress Notes (Signed)
Joanna Shaw is a 26 y.o. G1P0 at [redacted]w[redacted]d.  Subjective: Comfortable w/ epidural  Objective: BP 106/41  Pulse 73  Temp(Src) 98.6 F (37 C) (Oral)  Resp 18  Ht 5\' 6"  (1.676 m)  Wt 73.483 kg (162 lb)  BMI 26.15 kg/m2  SpO2 100%  LMP 08/22/2010   Total I/O In: 1000 [Other:1000] Out: -   FHT:  FHR: 135 bpm, variability: moderate,  accelerations:  Present,  decelerations:  Absent UC:   irregular, every 2-5 minutes. MVU's 80-13 on 5 milliUnits/min pitocin SVE:   Dilation: 6.5 Effacement (%): 90 Station: 0 Exam by:: [place]  Labs:  Assessment / Plan: Protracted active phase  Labor: Progressing on slowly Pitocin, will continue to increase at high dose to achieve adequate labor pattern Preeclampsia:  NA Fetal Wellbeing:  Category I Pain Control:  Epidural I/D:  n/a Anticipated MOD:  NSVD  Oseas Detty 05/29/2011, 12:42 PM

## 2011-05-29 NOTE — H&P (Signed)
Joanna Shaw is a 26 y.o.black female presenting unannounced on her due date with CC of ctxs since "0212."  Denies any VB or LOF.  Accompanied by her s.o.  She denies UTI or PIH s/s.  Strongly considering an epidural.  She denies any recent illness or fever.  Pt's prenatal care has been overall uncomplicated.  However, she did not start care until around 20 weeks.  She was treated for chlamydia first week of December, along with her partner.  She declined quad screen.  She has had normal pregnancy discomforts.  She had a normal anatomy u/s.  Treated for BV x2 in 2nd trimester.  Her gc/ct and GBS cx's were negative late 3rd trimester.    Maternal Medical History:  Reason for admission: Reason for admission: contractions.  Contractions: Onset was 1-2 hours ago.   Frequency: regular.   Perceived severity is strong.    Fetal activity: Perceived fetal activity is normal.   Last perceived fetal movement was within the past hour.    Prenatal complications: 1.  Late to Care 2.  Positive chlamydia 01/10/11 in 2nd trimester 3.. Low platelets 3rd trimester 4.  H/o abnl pap 5.  H/o irreg cycles 6.  BV x2 in 2nd trimester    OB History    Grav Para Term Preterm Abortions TAB SAB Ect Mult Living   1 1 1       1      Past Medical History  Diagnosis Date  . No pertinent past medical history   . Abnormal Pap smear 2008  . Chlamydia infection 2006  . Chlamydia contact, treated 01/11/2011    Treated/ neg TOC  . Chlamydia contact, treated 01/2011    Treated/ neg TOC  . Late prenatal care complicating pregnancy   . Temporary low platelet count     at 28 weeks   Past Surgical History  Procedure Date  . No past surgeries    Family History: family history includes Alcohol abuse in her mother and Hypertension in her maternal grandmother and mother.  There is no history of Anesthesia problems, and Hypotension, and Malignant hyperthermia, and Pseudochol deficiency, . Social History:  reports  that she has never smoked. She does not have any smokeless tobacco history on file. She reports that she does not drink alcohol or use illicit drugs.Pt has had 20 years of education and had been working in [employer].    Review of Systems  Constitutional: Negative.   HENT: Negative.   Eyes: Negative.   Respiratory: Negative.   Cardiovascular: Negative.   Gastrointestinal: Negative.   Genitourinary: Negative.   Skin: Negative.   Neurological: Negative.     Blood pressure 115/68, pulse 89, temperature 98.7 F (37.1 C), temperature source Oral, resp. rate 20, height 5\' 6"  (1.676 m), weight 73.483 kg (162 lb), last menstrual period 08/22/2010, SpO2 100.00%, unknown if currently breastfeeding. Maternal Exam:  Uterine Assessment: Contraction strength is moderate.  Contraction frequency is irregular.  Couplet pattern:  freq q 1-5 min  Abdomen: Patient reports no abdominal tenderness. Fetal presentation: vertex  Pelvis: adequate for delivery.   Cervix: Cervix evaluated by digital exam.     Fetal Exam Fetal Monitor Review: Mode: ultrasound.   Baseline rate: 125.  Variability: moderate (6-25 bpm).   Pattern: accelerations present and no decelerations.    Fetal State Assessment: Category I - tracings are normal.     Physical Exam  Constitutional: She is oriented to person, place, and time. She appears well-developed and  well-nourished. She appears distressed.       Breathing and rocking in bed w/ ctxs  HENT:  Head: Normocephalic and atraumatic.  Cardiovascular: Normal rate.   Respiratory: Effort normal.  GI: Soft.       gravid  Genitourinary:       Cx:  5/80/-1 to 0 and posterior w/ BBOW  Musculoskeletal: She exhibits no edema.  Neurological: She is alert and oriented to person, place, and time. She has normal reflexes.  Skin: Skin is warm and dry.    Prenatal labs: ABO, Rh: O/Positive/-- (11/14 0000) Antibody: Negative (11/14 0000) Rubella: Immune (11/14 0000) RPR:  NON REACTIVE (04/21 0419)  HBsAg: Negative (11/14 0000)  HIV: Non-reactive (11/14 0000)  GBS: Negative (03/27 0000)  1hr gtt=129 Platelets:  =161 at 20 weeks                   =143 on 3/27 (at 1hr gtt)                   Assessment/Plan: 1.  IUP at 40 weeks 2.  Active labor 3.  Cat I FHT 4.  GBS neg 5.  Considering epidural 6.  H/o thrombocytopenia third trimester 7. H/o chlamydia 2nd trimester  1.  Admit to BS with Dr. Su Hilt as attending 2.  Routine L&D orders 3.  Epidural if pt desires, or support as needed 4.  Will AROM prn augmentation 5.  C/w MD prn  Lindaann Gradilla H 05/29/2011, 10:20 PM

## 2011-05-29 NOTE — Progress Notes (Signed)
Joanna Shaw is a 26 y.o. G1P0 at [redacted]w[redacted]d.  Subjective: Comfortable w/ epidural  Objective: BP 103/75  Pulse 67  Temp(Src) 98.4 F (36.9 C) (Oral)  Resp 18  Ht 5\' 6"  (1.676 m)  Wt 73.483 kg (162 lb)  BMI 26.15 kg/m2  SpO2 100%  LMP 08/22/2010      FHT:  FHR: 125 bpm, variability: min-mod,  accelerations:  Present,  decelerations:  Absent UC:   irregular, every 2-5 minutes, moderate SVE:   Dilation: 6 Effacement (%): 90 Station: -1 Exam by:: [employer]: Lab Results  Component Value Date   WBC 5.6 05/29/2011   HGB 10.4* 05/29/2011   HCT 31.7* 05/29/2011   MCV 85.9 05/29/2011   PLT 129* 05/29/2011    Assessment / Plan: Protracted active phase  Labor: minimal progress x 41/2 hours. IUPC placed without difficulty. Start pitocin. Preeclampsia:  NA Fetal Wellbeing:  Category I Pain Control:  Epidural I/D:  n/a Anticipated MOD:  NSVD  Joanna Shaw 05/29/2011, 9:43 AM

## 2011-05-30 ENCOUNTER — Encounter (HOSPITAL_COMMUNITY): Payer: Self-pay | Admitting: Obstetrics and Gynecology

## 2011-05-30 LAB — CBC
Hemoglobin: 9.6 g/dL — ABNORMAL LOW (ref 12.0–15.0)
MCH: 28.3 pg (ref 26.0–34.0)
RBC: 3.39 MIL/uL — ABNORMAL LOW (ref 3.87–5.11)
WBC: 14.7 10*3/uL — ABNORMAL HIGH (ref 4.0–10.5)

## 2011-05-30 MED ORDER — NALOXONE HCL 0.4 MG/ML IJ SOLN: 0.4000 mg | INTRAMUSCULAR | Status: DC | PRN

## 2011-05-30 MED ORDER — ZOLPIDEM TARTRATE 5 MG PO TABS: 5.0000 mg | ORAL_TABLET | Freq: Every evening | ORAL | Status: DC | PRN

## 2011-05-30 MED ORDER — ONDANSETRON HCL 4 MG/2ML IJ SOLN
4.0000 mg | INTRAMUSCULAR | Status: DC | PRN
  Administered 2011-05-30: 4 mg via INTRAVENOUS
  Filled 2011-05-30: qty 2

## 2011-05-30 MED ORDER — ONDANSETRON HCL 4 MG/2ML IJ SOLN: 4.0000 mg | Freq: Three times a day (TID) | INTRAMUSCULAR | Status: DC | PRN

## 2011-05-30 MED ORDER — SIMETHICONE 80 MG PO CHEW: 80.0000 mg | CHEWABLE_TABLET | ORAL | Status: DC | PRN

## 2011-05-30 MED ORDER — SODIUM CHLORIDE 0.9 % IJ SOLN: 3.0000 mL | INTRAMUSCULAR | Status: DC | PRN

## 2011-05-30 MED ORDER — LANOLIN HYDROUS EX OINT: 1.0000 "application " | TOPICAL_OINTMENT | CUTANEOUS | Status: DC | PRN

## 2011-05-30 MED ORDER — SODIUM CHLORIDE 0.9 % IV SOLN
1.0000 ug/kg/h | INTRAVENOUS | Status: DC | PRN
  Filled 2011-05-30: qty 2.5

## 2011-05-30 MED ORDER — IBUPROFEN 600 MG PO TABS
600.0000 mg | ORAL_TABLET | Freq: Four times a day (QID) | ORAL | Status: DC
  Administered 2011-05-30 – 2011-05-31 (×7): 600 mg via ORAL
  Filled 2011-05-30 (×8): qty 1

## 2011-05-30 MED ORDER — NALBUPHINE SYRINGE 5 MG/0.5 ML
5.0000 mg | INJECTION | INTRAMUSCULAR | Status: DC | PRN
  Filled 2011-05-30: qty 1

## 2011-05-30 MED ORDER — DIPHENHYDRAMINE HCL 50 MG/ML IJ SOLN: 12.5000 mg | INTRAMUSCULAR | Status: DC | PRN

## 2011-05-30 MED ORDER — DIBUCAINE 1 % RE OINT: 1.0000 "application " | TOPICAL_OINTMENT | RECTAL | Status: DC | PRN

## 2011-05-30 MED ORDER — OXYCODONE-ACETAMINOPHEN 5-325 MG PO TABS
1.0000 | ORAL_TABLET | ORAL | Status: DC | PRN
  Administered 2011-05-30: 2 via ORAL
  Administered 2011-05-30: 1 via ORAL
  Administered 2011-05-31 (×3): 2 via ORAL
  Administered 2011-06-01: 1 via ORAL
  Filled 2011-05-30 (×2): qty 2
  Filled 2011-05-30 (×2): qty 1
  Filled 2011-05-30 (×2): qty 2

## 2011-05-30 MED ORDER — DIPHENHYDRAMINE HCL 50 MG/ML IJ SOLN: 25.0000 mg | INTRAMUSCULAR | Status: DC | PRN

## 2011-05-30 MED ORDER — SENNOSIDES-DOCUSATE SODIUM 8.6-50 MG PO TABS
2.0000 | ORAL_TABLET | Freq: Every day | ORAL | Status: DC
  Administered 2011-05-30 – 2011-05-31 (×2): 2 via ORAL

## 2011-05-30 MED ORDER — TETANUS-DIPHTH-ACELL PERTUSSIS 5-2.5-18.5 LF-MCG/0.5 IM SUSP
0.5000 mL | Freq: Once | INTRAMUSCULAR | Status: AC
  Administered 2011-05-30: 0.5 mL via INTRAMUSCULAR

## 2011-05-30 MED ORDER — LACTATED RINGERS IV SOLN
INTRAVENOUS | Status: DC
  Administered 2011-05-30 (×2): via INTRAVENOUS

## 2011-05-30 MED ORDER — OXYTOCIN 20 UNITS IN LACTATED RINGERS INFUSION - SIMPLE: 125.0000 mL/h | INTRAVENOUS | Status: AC

## 2011-05-30 MED ORDER — ONDANSETRON HCL 4 MG PO TABS: 4.0000 mg | ORAL_TABLET | ORAL | Status: DC | PRN

## 2011-05-30 MED ORDER — DIPHENHYDRAMINE HCL 25 MG PO CAPS
25.0000 mg | ORAL_CAPSULE | ORAL | Status: DC | PRN
  Administered 2011-05-31: 25 mg via ORAL
  Filled 2011-05-30 (×2): qty 1

## 2011-05-30 MED ORDER — PRENATAL MULTIVITAMIN CH
1.0000 | ORAL_TABLET | Freq: Every day | ORAL | Status: DC
  Administered 2011-05-30 – 2011-06-01 (×3): 1 via ORAL
  Filled 2011-05-30 (×3): qty 1

## 2011-05-30 MED ORDER — SIMETHICONE 80 MG PO CHEW
80.0000 mg | CHEWABLE_TABLET | Freq: Three times a day (TID) | ORAL | Status: DC
  Administered 2011-05-30 – 2011-06-01 (×7): 80 mg via ORAL

## 2011-05-30 MED ORDER — METOCLOPRAMIDE HCL 5 MG/ML IJ SOLN: 10.0000 mg | Freq: Three times a day (TID) | INTRAMUSCULAR | Status: DC | PRN

## 2011-05-30 MED ORDER — IBUPROFEN 600 MG PO TABS: 600.0000 mg | ORAL_TABLET | Freq: Four times a day (QID) | ORAL | Status: DC | PRN

## 2011-05-30 MED ORDER — DIPHENHYDRAMINE HCL 25 MG PO CAPS
25.0000 mg | ORAL_CAPSULE | Freq: Four times a day (QID) | ORAL | Status: DC | PRN
  Administered 2011-05-30: 25 mg via ORAL

## 2011-05-30 MED ORDER — WITCH HAZEL-GLYCERIN EX PADS: 1.0000 "application " | MEDICATED_PAD | CUTANEOUS | Status: DC | PRN

## 2011-05-30 MED ORDER — MENTHOL 3 MG MT LOZG: 1.0000 | LOZENGE | OROMUCOSAL | Status: DC | PRN

## 2011-05-30 NOTE — Anesthesia Postprocedure Evaluation (Signed)
  Anesthesia Post-op Note  Patient: Joanna Shaw  Procedure(s) Performed: Procedure(s) (LRB): CESAREAN SECTION (N/A)  Patient Location: Mother/Baby  Anesthesia Type: Epidural  Level of Consciousness: awake, alert  and oriented  Airway and Oxygen Therapy: Patient Spontanous Breathing  Post-op Pain: none  Post-op Assessment: Post-op Vital signs reviewed, Patient's Cardiovascular Status Stable, No headache, No backache, No residual numbness and No residual motor weakness  Post-op Vital Signs: Reviewed and stable  Complications: No apparent anesthesia complications

## 2011-05-30 NOTE — Progress Notes (Signed)
UR chart review completed.  

## 2011-05-30 NOTE — Progress Notes (Addendum)
S: comfortable, little bleeding, slept little bottle     Feeding, no N,V O VSS alert, no distress, Lungs clear bilaterally, AP RRR, abd softly distended, bowel sounds hypoactive, dressing dry     ff  sm  flowperineum clean intact     -Homans sign bilaterally,       No edema A normal involution     Lactating     Pp day  P plan foley out, ambulation,regular diet, continue care Lavera Guise, CNM

## 2011-05-30 NOTE — Addendum Note (Signed)
Addendum  created 05/30/11 0800 by Shanon Payor, CRNA   Modules edited:Notes Section

## 2011-05-31 NOTE — Progress Notes (Signed)
Subjective: Postpartum Day 1 Cesarean Delivery Patient reports tolerating PO, + flatus and no problems voiding.  Pain meds working well.  Objective: Vital signs in last 24 hours: Temp:  [97.9 F (36.6 C)-98.1 F (36.7 C)] 97.9 F (36.6 C) (04/23 0620) Pulse Rate:  [67-81] 81  (04/23 0620) Resp:  [18] 18  (04/23 0620) BP: (95-105)/(60-72) 97/60 mmHg (04/23 0620) SpO2:  [100 %] 100 % (04/22 2100)  Physical Exam:  General: alert, cooperative and no distress Lochia: appropriate Uterine Fundus: firm Incision: healing well, no dehiscence, no significant erythema with steri strips DVT Evaluation: Negative Homan's sign. No significant calf/ankle edema. Lungs clear bilaterally, AP RRR, abd distended, typmpanic, bowel sounds active  Basename 05/30/11 0542 05/29/11 2231  HGB 9.6* 9.4*  HCT 29.4* 28.2*    Assessment/Plan: Anemia nonlactating Status post Cesarean section. Doing well postoperatively. birth control options discussed undecided. Continue current care.  Dillon Mcreynolds 05/31/2011, 8:32 AM

## 2011-05-31 NOTE — Plan of Care (Signed)
Problem: Discharge Progression Outcomes Goal: Remove staples per MD order Outcome: Not Applicable Date Met:  05/31/11 Steri strips and internal sutures.

## 2011-06-01 ENCOUNTER — Encounter: Payer: Medicaid Other | Admitting: Obstetrics and Gynecology

## 2011-06-01 DIAGNOSIS — D649 Anemia, unspecified: Secondary | ICD-10-CM | POA: Diagnosis not present

## 2011-06-01 NOTE — Discharge Instructions (Signed)
Colace (stool softener) twice a day or Miralax daily for constipation.  Over-the-counter Iron supplement

## 2011-06-01 NOTE — Discharge Summary (Signed)
Obstetric Discharge Summary Reason for Admission: onset of labor Prenatal Procedures: ultrasound Intrapartum Procedures: cesarean: low cervical, transverse Postpartum Procedures: none Complications-Operative and Postpartum: none Hemoglobin  Date Value Range Status  05/30/2011 9.6* 12.0-15.0 (g/dL) Final     HCT  Date Value Range Status  05/30/2011 29.4* 36.0-46.0 (%) Final    Physical Exam:  General: alert, cooperative and no distress Lochia: appropriate Uterine Fundus: firm Incision: healing well, no significant drainage, no dehiscence, no significant erythema, steri-strips in place DVT Evaluation: No cords or calf tenderness. Calf/Ankle edema is present.  Discharge Diagnoses: Term Pregnancy-delivered, Anemia  Discharge Information: Date: 06/01/2011 Activity: pelvic rest x 6 weeks Diet: routine and increase iron-rich foods Medications: PNV, Ibuprofen, Colace, Iron, Percocet and Miralax as needed Condition: stable Instructions: refer to practice specific booklet Discharge to: home Follow-up Information    Follow up with [facility] in 6 weeks. (or [facility] as needed)    Contact information:   [address], [address] [place] [address]          Newborn Data: Live born female  Birth Weight: 7 lb 10.2 oz (3465 g) APGAR: 8, 9  Home with mother. Bottle feeding Contraception: Depo-Provera  Dorathy Kinsman 06/01/2011, 9:17 AM

## 2011-06-02 NOTE — Op Note (Signed)
Indications: failure to progress: arrest of dilation   Pre-operative Diagnosis: 40 week 0 day pregnancy.   Post-operative Diagnosis: same   Surgeon: Purcell Nails   Assistants: Lavera Guise, CNM   Anesthesia: Epidural anesthesia   Procedure Details  The patient was seen in the Holding Room. The risks, benefits, complications, treatment options, and expected outcomes were discussed with the patient. The patient concurred with the proposed plan, giving informed consent. The site of surgery properly noted/marked. The patient was taken to Operating Room # 1, identified as Laparis T Mcray and the procedure verified as C-Section Delivery. A Time Out was held and the above information confirmed.  After induction of anesthesia, the patient was draped and prepped in the usual sterile manner. A Pfannenstiel incision was made and carried down through the subcutaneous tissue to the fascia. Fascial incision was made and extended transversely. The fascia was separated from the underlying rectus tissue superiorly and inferiorly. The peritoneum was identified and entered. Peritoneal incision was extended longitudinally. The utero-vesical peritoneal reflection was incised transversely and the bladder flap was bluntly freed from the lower uterine segment. A low transverse uterine incision was made. Infant was delivered from cephalic, DOP presentation with Apgar scores of 8 at one minute and 9 at five minutes. After the umbilical cord was clamped and cut cord blood was obtained for evaluation. The placenta was removed intact and appeared normal. The uterine outline, tubes and ovaries appeared normal. The uterine incision was closed with running locked sutures of 0 Vicryl. A second imbricating layer was performed. Hemostasis was observed. Lavage was carried out until clear. The fascia was then reapproximated with running sutures of 0 Vicryl. SQ was reapproximated with 2-0 plain. The skin was reapproximated with  3.0 and Monocryl.  Instrument, sponge, and needle counts were correct prior the abdominal closure and at the conclusion of the case.   Findings:  Normal bilateral ovaries and tubes   Estimated Blood Loss: 700 cc   Drains: foley to gravity 250 cc   Total IV Fluids: 1000 ml   Specimens: Placenta   Complications: None; patient tolerated the procedure well.   Disposition: PACU - hemodynamically stable.   Condition: stable   Attending Attestation: I was present and scrubbed for the entire procedure.

## 2011-07-06 ENCOUNTER — Encounter: Payer: Self-pay | Admitting: Obstetrics and Gynecology

## 2011-07-06 ENCOUNTER — Ambulatory Visit (INDEPENDENT_AMBULATORY_CARE_PROVIDER_SITE_OTHER): Payer: Medicaid Other | Admitting: Obstetrics and Gynecology

## 2011-07-06 VITALS — BP 90/58 | Resp 14 | Wt 132.0 lb

## 2011-07-06 DIAGNOSIS — IMO0001 Reserved for inherently not codable concepts without codable children: Secondary | ICD-10-CM

## 2011-07-06 LAB — POCT URINE PREGNANCY: Preg Test, Ur: NEGATIVE

## 2011-07-06 MED ORDER — MEDROXYPROGESTERONE ACETATE 150 MG/ML IM SUSP: 150.0000 mg | INTRAMUSCULAR | Status: DC

## 2011-07-06 NOTE — Progress Notes (Signed)
Subjective:     Joanna Shaw is a 26 y.o. female who presents for a postpartum visit.  I have fully reviewed the prenatal and intrapartum course.   Doing well--had primary C/S due to FTP. Wants Depo. Has had protected IC.  PPDS = 4--denies PPD   Patient is sexually active.   The following portions of the patient's history were reviewed and updated as appropriate: allergies, current medications, past family history, past medical history, past social history, past surgical history and problem list.  Review of Systems Pertinent items are noted in HPI.   Objective:    BP 90/58  Resp 14  Wt 132 lb (59.875 kg)  Breastfeeding? No  General:  alert, cooperative and no distress     Lungs: clear to auscultation bilaterally  Heart:  regular rate and rhythm, S1, S2 normal, no murmur  Abdomen: soft, non-tender; bowel sounds normal; no masses,  no organomegaly   Vulva:  normal  Vagina: normal vagina  Cervix:  normal  Corpus: normal size, contour, position, consistency, mobility, non-tender  Adnexa:  normal adnexa             Assessment:     Normal postpartum exam.  Pap smear not done at today's visit.   Plan:     1. Contraception: Wants Depo--Rx sent to pharmacy.  Patient will return this week for Depo injection only. 2. Follow up in: December, 2013 for pap, q 12 weeks for repeat Depo, or as needed.    Nigel Bridgeman CNM 07/06/2011 2:39 PM

## 2011-07-06 NOTE — Progress Notes (Signed)
Date of delivery: 05/29/2011 Female Name: Joanna Shaw Vaginal delivery:no Cesarean section:yes Tubal ligation:no GDM:no Breast Feeding:no Bottle Feeding:yes Post-Partum Blues:no Abnormal pap:yes 2009 Normal GU function: yes Normal GI function:yes Returning to work:yes ASAP! EPDS: 4  *Pt wants Depo today Pt had IC during 6 wk PP

## 2011-07-07 ENCOUNTER — Other Ambulatory Visit (INDEPENDENT_AMBULATORY_CARE_PROVIDER_SITE_OTHER): Payer: Medicaid Other

## 2011-07-07 DIAGNOSIS — Z3009 Encounter for other general counseling and advice on contraception: Secondary | ICD-10-CM

## 2011-07-07 MED ORDER — MEDROXYPROGESTERONE ACETATE 150 MG/ML IM SUSP
150.0000 mg | Freq: Once | INTRAMUSCULAR | Status: AC
  Administered 2011-07-07: 150 mg via INTRAMUSCULAR

## 2011-07-07 NOTE — Progress Notes (Unsigned)
Next Depo due -09-28-2011

## 2011-07-13 ENCOUNTER — Other Ambulatory Visit: Payer: Medicaid Other

## 2011-09-27 ENCOUNTER — Other Ambulatory Visit: Payer: Medicaid Other

## 2012-09-16 ENCOUNTER — Emergency Department (INDEPENDENT_AMBULATORY_CARE_PROVIDER_SITE_OTHER)
Admission: EM | Admit: 2012-09-16 | Discharge: 2012-09-16 | Disposition: A | Discharge status: Home / Self Care | Payer: Self-pay | Source: Home / Self Care | Attending: Emergency Medicine | Admitting: Emergency Medicine

## 2012-09-16 ENCOUNTER — Encounter (HOSPITAL_COMMUNITY): Payer: Self-pay | Admitting: Emergency Medicine

## 2012-09-16 DIAGNOSIS — N390 Urinary tract infection, site not specified: Secondary | ICD-10-CM

## 2012-09-16 LAB — POCT URINALYSIS DIP (DEVICE)
Bilirubin Urine: NEGATIVE
Glucose, UA: NEGATIVE mg/dL
Nitrite: NEGATIVE

## 2012-09-16 LAB — POCT PREGNANCY, URINE: Preg Test, Ur: NEGATIVE

## 2012-09-16 MED ORDER — CEPHALEXIN 500 MG PO CAPS: 500.0000 mg | ORAL_CAPSULE | Freq: Four times a day (QID) | ORAL | Status: AC

## 2012-09-16 NOTE — ED Provider Notes (Addendum)
CSN: [id number]     Arrival date & time 09/16/12  1334 History     First MD Initiated Contact with Patient 09/16/12 1345     Chief Complaint  Patient presents with  . Cystitis   (Consider location/radiation/quality/duration/timing/severity/associated sxs/prior Treatment) HPI Comments: Patient presents to urgent care describing that since yesterday she's been expressing increasing frequency pressure and burning with urination. Denies any fevers nausea vomiting or flank pain. Patient denies any vaginal discharge, pelvic pain at rest, denies any concerns of having been exposed to an STD.  Patient is a 27 y.o. female presenting with dysuria. The history is provided by the patient.  Dysuria Pain quality:  Sharp Pain severity:  Moderate Onset quality:  Sudden Timing:  Constant Progression:  Worsening Urinary symptoms: discolored urine, foul-smelling urine, frequent urination and hesitancy   Associated symptoms: abdominal pain   Associated symptoms: no fever, no flank pain, no genital lesions, no nausea, no vaginal discharge and no vomiting   Risk factors: no hx of pyelonephritis, no hx of urolithiasis, not pregnant, no recurrent urinary tract infections, no renal disease, not sexually active, no sexually transmitted infections and no urinary catheter     Past Medical History  Diagnosis Date  . No pertinent past medical history   . Abnormal Pap smear 2008  . Chlamydia infection 2006  . Chlamydia contact, treated 01/11/2011    Treated/ neg TOC  . Chlamydia contact, treated 01/2011    Treated/ neg TOC  . Late prenatal care complicating pregnancy   . Temporary low platelet count     at 28 weeks   Past Surgical History  Procedure Laterality Date  . No past surgeries    . Cesarean section  05/29/2011    Procedure: CESAREAN SECTION;  Surgeon: Purcell Nails, MD;  Location: [facility];  Service: Gynecology;  Laterality: N/A;  Primar cesarean section of baby  girl at 2043 APGAR   Family  History  Problem Relation Age of Onset  . Hypertension Mother   . Alcohol abuse Mother   . Hypertension Maternal Grandmother   . Anesthesia problems Neg Hx   . Hypotension Neg Hx   . Malignant hyperthermia Neg Hx   . Pseudochol deficiency Neg Hx    History  Substance Use Topics  . Smoking status: Never Smoker   . Smokeless tobacco: Never Used  . Alcohol Use: No   OB History   Grav Para Term Preterm Abortions TAB SAB Ect Mult Living   1 1 1       1      Review of Systems  Constitutional: Negative for fever, chills, activity change and appetite change.  Gastrointestinal: Positive for abdominal pain. Negative for nausea and vomiting.  Genitourinary: Positive for dysuria and frequency. Negative for urgency, hematuria, flank pain, vaginal discharge, difficulty urinating, genital sores, menstrual problem and pelvic pain.  Skin: Negative for rash.    Allergies  Review of patient's allergies indicates no known allergies.  Home Medications   Current Outpatient Rx  Name  Route  Sig  Dispense  Refill  . medroxyPROGESTERone (DEPO-PROVERA) 150 MG/ML injection   Intramuscular   Inject 1 mL (150 mg total) into the muscle every 3 (three) months.   1 mL   4   . cephALEXin (KEFLEX) 500 MG capsule   Oral   Take 1 capsule (500 mg total) by mouth 4 (four) times daily.   28 capsule   0    BP 135/85  Pulse 87  Temp(Src) 99.1  F (37.3 C) (Oral)  Resp 14  SpO2 99%  LMP 08/16/2012  Breastfeeding? Unknown Physical Exam  Nursing note and vitals reviewed. Constitutional: She appears well-developed and well-nourished.  Pulmonary/Chest: Effort normal.  Abdominal: Soft. She exhibits no distension and no mass. There is no hepatosplenomegaly, splenomegaly or hepatomegaly. There is tenderness. There is no rebound, no guarding and no CVA tenderness. No hernia. Hernia confirmed negative in the ventral area.  Neurological: She is alert.  Skin: No rash noted. No erythema.    ED Course    Procedures (including critical care time)  Labs Reviewed  POCT URINALYSIS DIP (DEVICE) - Abnormal; Notable for the following:    Hgb urine dipstick LARGE (*)    Protein, ur 30 (*)    Leukocytes, UA LARGE (*)    All other components within normal limits  URINE CULTURE  POCT PREGNANCY, URINE   No results found. 1. Urinary tract infection     MDM  Patient looks comfortable afebrile unable to reproduce any flank pain on percussion.   Plan of care diagnostic impression,  Patient to start with antibiotics and drink abundant fluids. We discuss symptoms that should warrant to return for reevaluation. A urine culture has also been ordered.  Discharge Medication List as of 09/16/2012  2:21 PM    START taking these medications   Details  cephALEXin (KEFLEX) 500 MG capsule Take 1 capsule (500 mg total) by mouth 4 (four) times daily., Starting 09/16/2012, Last dose on Sun 09/23/12, Normal        Jimmie Molly, MD 09/16/12 1426  Jimmie Molly, MD 09/16/12 1427

## 2012-09-16 NOTE — ED Notes (Signed)
Pt c/o of burning sensation and abdominal pain after urinating onset yesterday. Pt denies fever. No n/v/d. Pt is alert and oriented in no distress.

## 2012-09-18 LAB — URINE CULTURE
Colony Count: >=100000
Special Requests: NORMAL

## 2012-09-18 NOTE — ED Notes (Signed)
Urine culture: >100,000 colonies staph. Species ( coagulase neg.).  Pt. treated with Keflex-not on sensitivity.  Message to Dr. Ladon Applebaum. Vassie Moselle 09/18/2012

## 2012-09-23 ENCOUNTER — Telehealth (HOSPITAL_COMMUNITY): Payer: Self-pay | Admitting: *Deleted

## 2012-09-23 MED ORDER — NITROFURANTOIN MONOHYD MACRO 100 MG PO CAPS: 100.0000 mg | ORAL_CAPSULE | Freq: Two times a day (BID) | ORAL | Status: AC

## 2012-09-23 NOTE — ED Notes (Signed)
Dr. Ladon Applebaum gave order for [facility] if pt. still symptomatic.  I called and left a message to call. Vassie Moselle 09/23/2012

## 2012-09-24 ENCOUNTER — Telehealth (HOSPITAL_COMMUNITY): Payer: Self-pay | Admitting: *Deleted

## 2012-09-24 NOTE — ED Notes (Signed)
I called pt. Pt. verified x 2 and given results.I told her Dr. Ladon Applebaum wanted me to call to check for improvement.  I asked if she had any symptoms.  She said "no, I finished all of my medication and I'm fine."  I told her there was no need to change her antibiotic and thanked her. Vassie Moselle 09/24/2012

## 2013-11-02 IMAGING — US US OB DETAIL+14 WK
1 series · 12 of 28 positions shown · non-contrast
Comparison: none

[Series 1: us ob detail +14 wk · 109 acquisitions, 12 frames shown]
[im 5/109]
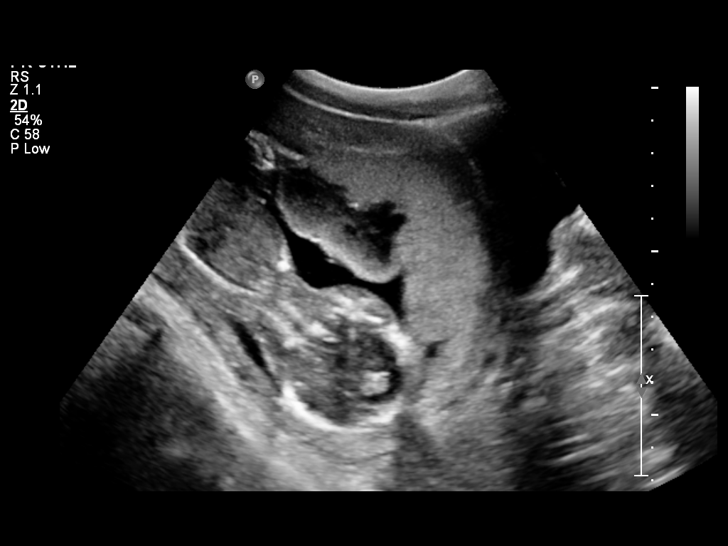
[im 13/109]
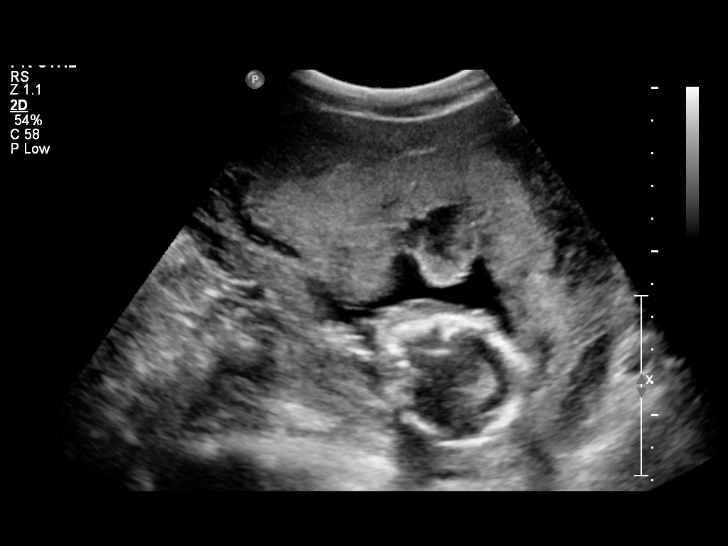
[im 21/109]
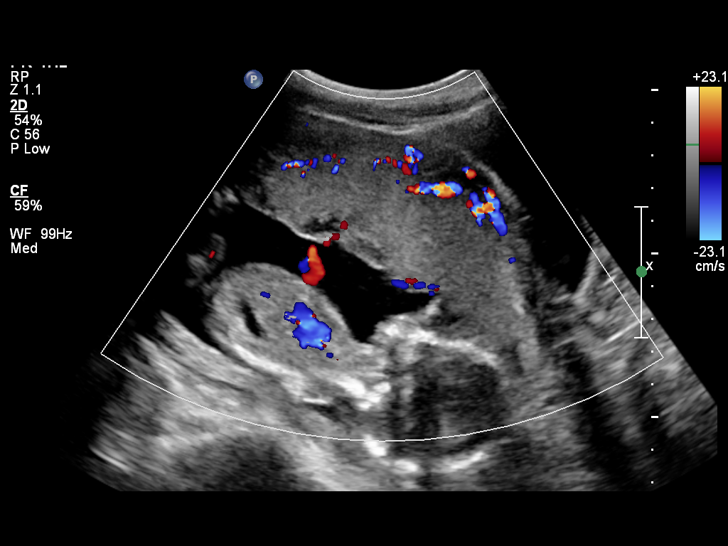
[im 33/109]
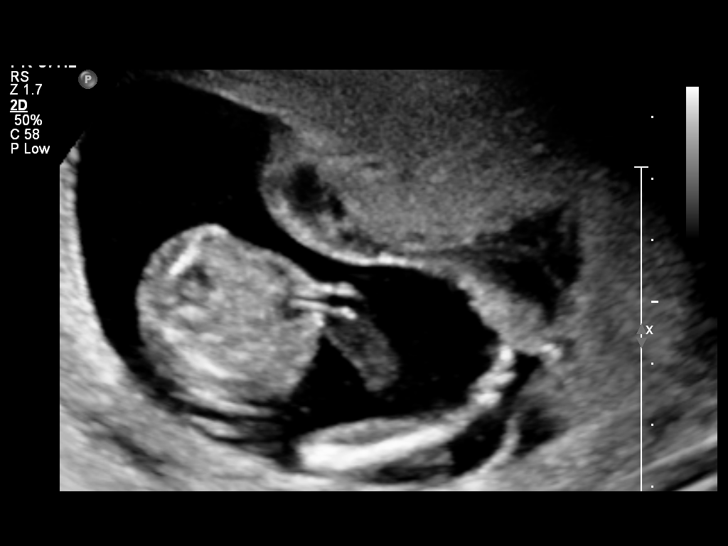
[im 41/109]
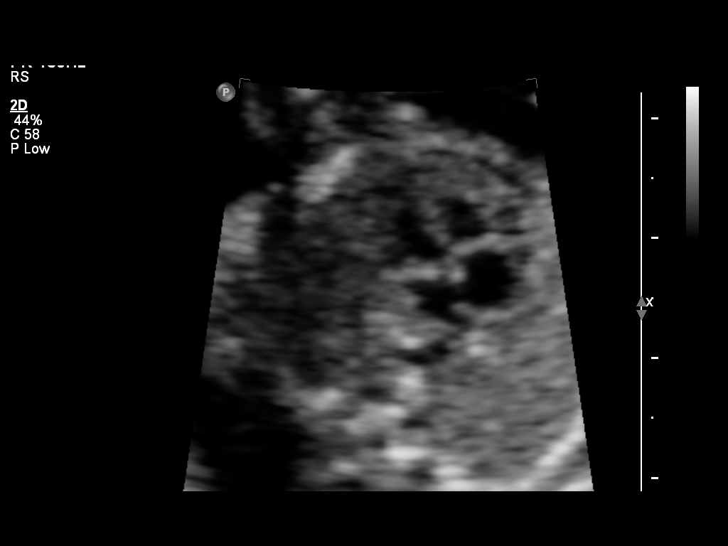
[im 49/109]
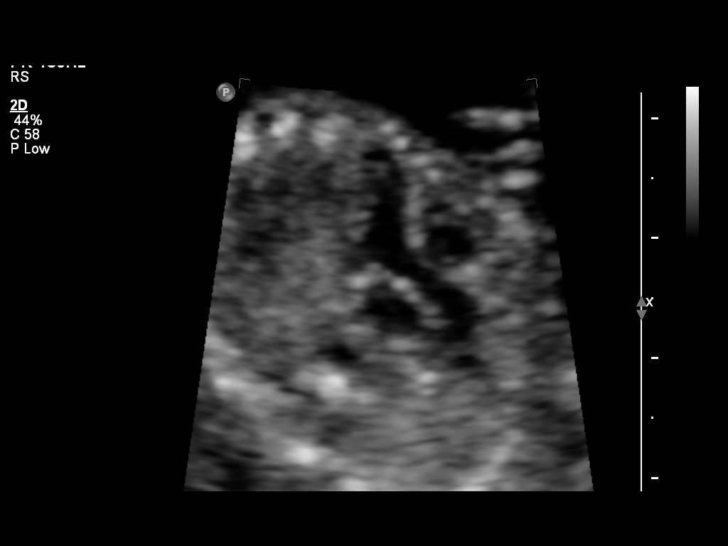
[im 61/109]
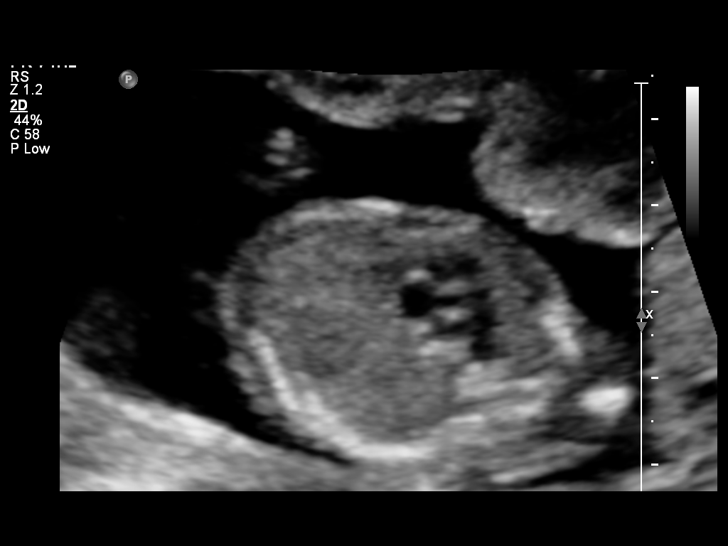
[im 69/109]
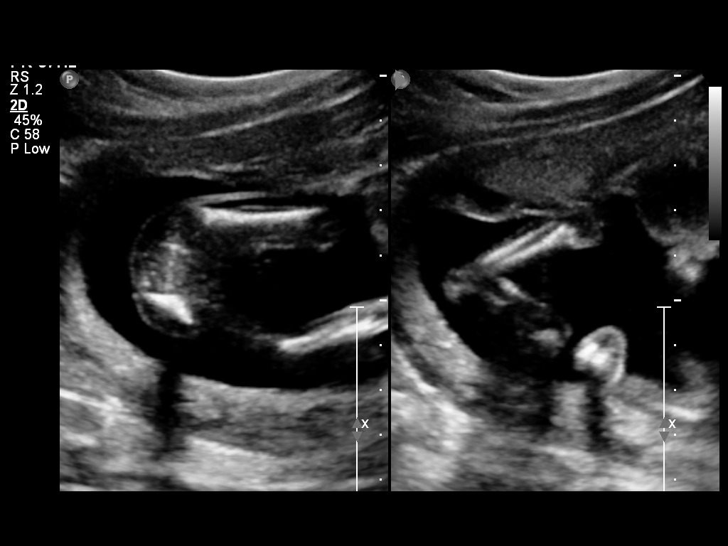
[im 77/109]
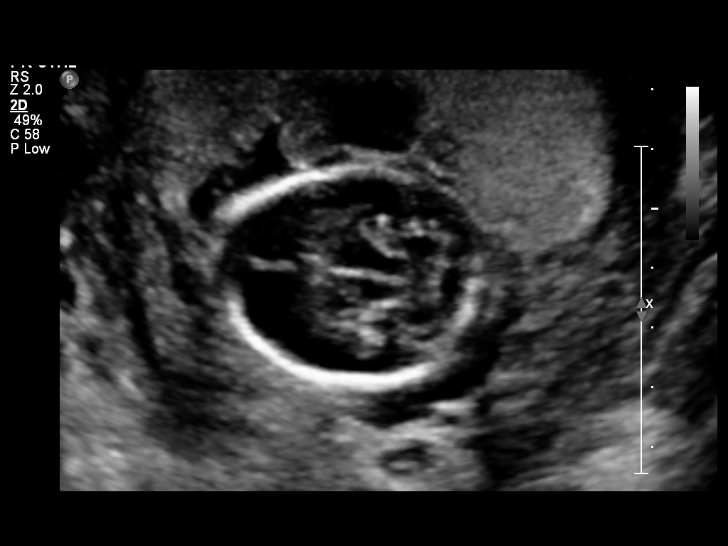
[im 89/109]
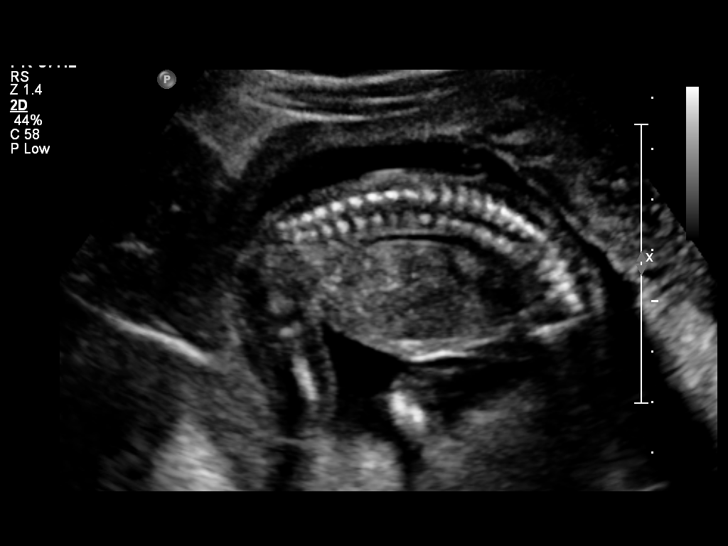
[im 97/109]
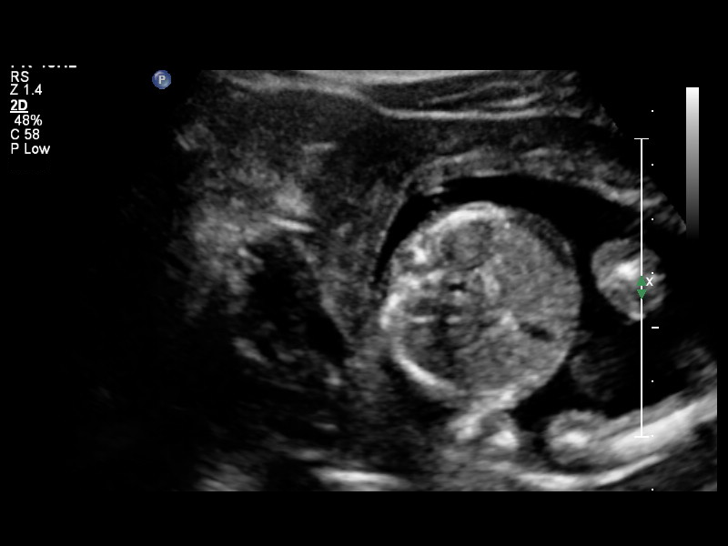
[im 105/109]
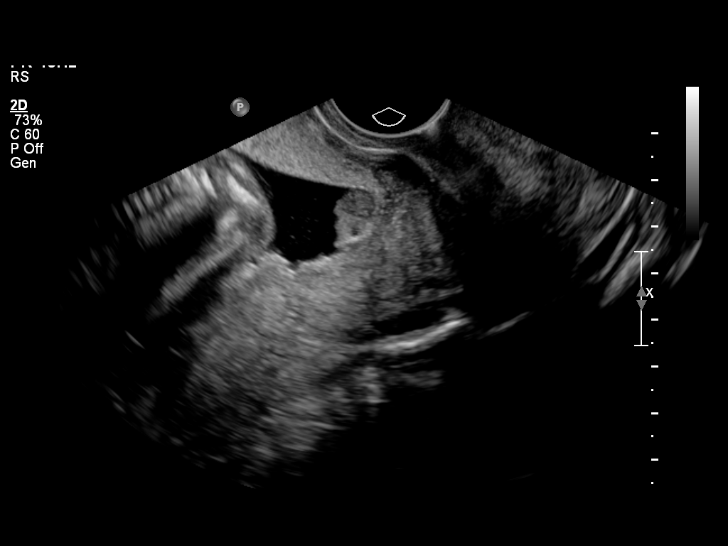

[12 of 28 positions shown; findings below may reference images not displayed]

OBSTETRICS REPORT
                      (Signed Final 12/22/2010 [DATE])

 Order#:         [id number]
Procedures

 US OB DETAIL + 14 WK                                  76811.0
 US OB Transvaginal                                    76817.0
Indications

 Detailed fetal anatomic survey
 Vaginal bleeding, unknown etiology
Fetal Evaluation

 Fetal Heart Rate:  158                          bpm
 Cardiac Activity:  Observed
 Presentation:      Cephalic
 Placenta:          Anterior, above cervical os
 P. Cord Insertion: Visualized

 Comment:    No placental abruption or previa identified.

 Amniotic Fluid
 AFI FV:      Subjectively within normal limits
                                              Larg Pckt:      4  cm
Biometry

 BPD:     34.6  mm     G. Age:  16w 5d                CI:          75.1  70 - 86
 OFD:     46.1  mm                                    FL/HC:       18.0  14.6 -

 HC:     129.8  mm     G. Age:  16w 4d        9  %    HC/AC:       1.11  1.07 -

 AC:     116.6  mm     G. Age:  17w 3d       48  %    FL/BPD:
 FL:      23.4  mm     G. Age:  17w 0d       31  %    FL/AC:       20.1  20 - 24
 HUM:     23.5  mm     G. Age:  17w 2d       52  %
 CER:     16.9  mm     G. Age:  16w 6d       38  %
 NFT:     3.25  mm

 Est. FW:     183  gm      0 lb 6 oz     46  %
Gestational Age

 LMP:           17w 3d        Date:  08/22/10                 EDD:   05/29/11
 U/S Today:     16w 6d                                        EDD:   06/02/11
 Best:          17w 3d     Det. By:  LMP  (08/22/10)          EDD:   05/29/11
Genetic Sonogram - Trisomy 21 Screening

 Age:                                              25         Risk=1:   885
 Echogenic bowel:                                  No         LR :
 Hypoplastic/absent Nasal bone:                    No
 Choroid plexus cysts:                             No
 Structural anomalies (inc. cardiac):              No         LR :
 Hypoplastic / absent midphalanx 5th Digit:        No
 Short femur:                                      No         LR :
 Short humerus:                                    No         LR :
 2-vessel umbilical cord:                          No
 Pyelectasis:                                      No         LR :
 Echogenic cardiac foci:                           No         LR :
 Nuchal fold thickening >= 6 mm:                   No         LR :

 11 Of 11 Criteria Were Visualized and 0 Abnormal(s) Were Seen.
 Ultrasound Modified Risk for Fetal Down Syndrome = [DATE]
Anatomy

 Cranium:           Appears normal      Aortic Arch:       Appears normal
 Fetal Cavum:       Appears normal      Ductal Arch:       Appears normal
 Ventricles:        Appears normal      Diaphragm:         Appears normal
 Choroid Plexus:    Appears normal      Stomach:           Appears normal,
                                                           left sided
 Cerebellum:        Appears normal      Abdomen:           Appears normal
 Posterior Fossa:   Appears normal      Abdominal Wall:    Appears nml
                                                           (cord insert, abd
                                                           wall)
 Nuchal Fold:       Appears normal      Cord Vessels:      Appears normal
                    (neck, nuchal                          (3 vessel cord)
                    fold)
 Face:              Appears normal      Kidneys:           Appear normal
                    (lips/profile/orbits
                    )
 Heart:             Appears normal      Bladder:           Appears normal
                    (4 chamber &
                    axis)
 RVOT:              Appears normal      Spine:             Appears normal
 LVOT:              Appears normal      Limbs:             Appears normal
                                                           (hands, ankles,
                                                           feet)

 Other:     Fetus appears to be a female. Heels and 5th digit appears
            normal. Nasal bone visualized.
Cervix Uterus Adnexa

 Cervical Length:    3.4       cm

 Cervix:       Normal appearance by transvaginal scan

 Left Ovary:    Not visualized.
 Right Ovary:   Not visualized.

 Adnexa:     No abnormality visualized.
 Comment:    Contraction noted in LUS
Impression

 Single living IUP.  US EGA is concordant with LMP.
 No fetal anomalies seen involving visualized anatomy.
 No sonographic markers for aneuploidy visualized.
 Normal cervical length. No placental abruption or previa
 identifed.

## 2013-12-03 IMAGING — CT CT ANGIO CHEST
2 of 6 series · 19 of 46 positions shown · IV contrast (APPLIED)
Comparison: None

CLINICAL DATA: Short of breath.  Pregnant

CT ANGIOGRAPHY CHEST WITH CONTRAST
TECHNIQUE: Multidetector CT imaging of the chest was performed
using the standard protocol during bolus administration of
intravenous contrast.  Multiplanar CT image reconstructions
including MIPs were obtained to evaluate the vascular anatomy.
Contrast: 100mL OMNIPAQUE IOHEXOL 350 MG/ML IV SOLN

[Series 6: pulm embolism 1.0 b25f thin · axial · 0.53mm/px · z∈[-240,-4]mm · 16 of 260 slices shown]
[im 12/260  lung]
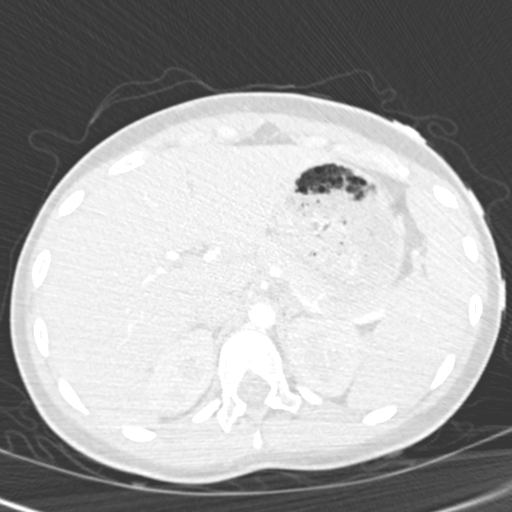
[im 34/260  soft-tissue]
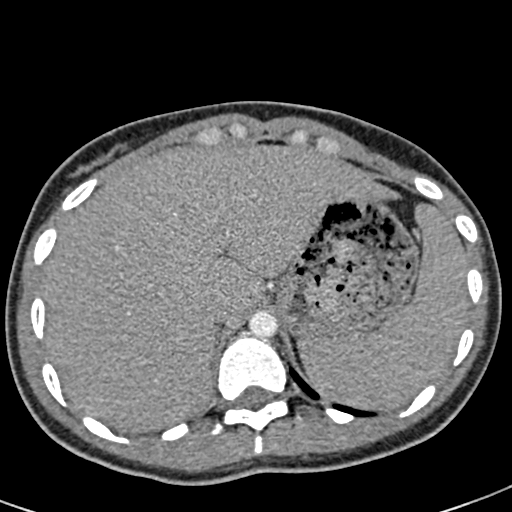
[im 46/260  lung]
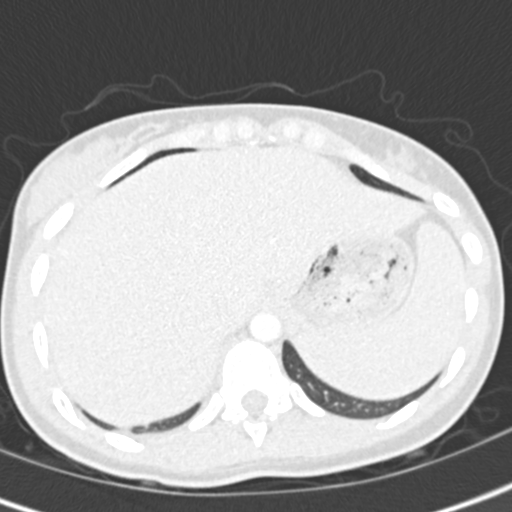
[im 57/260  soft-tissue]
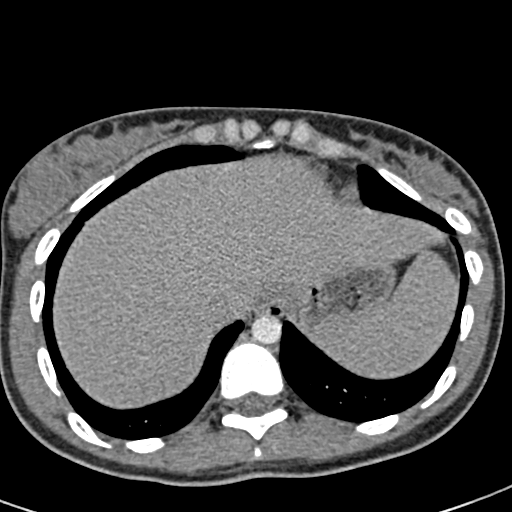
[im 79/260  lung]
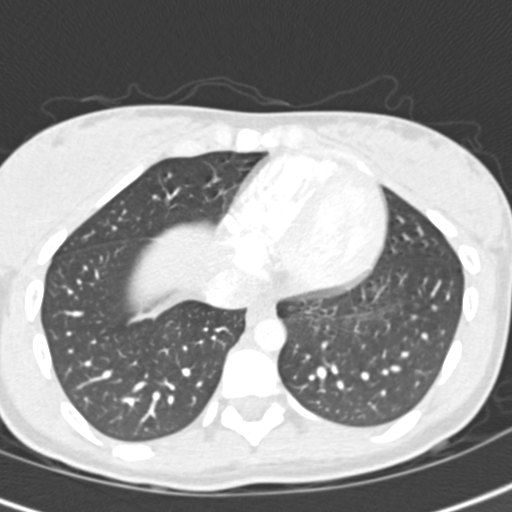
[im 91/260  soft-tissue]
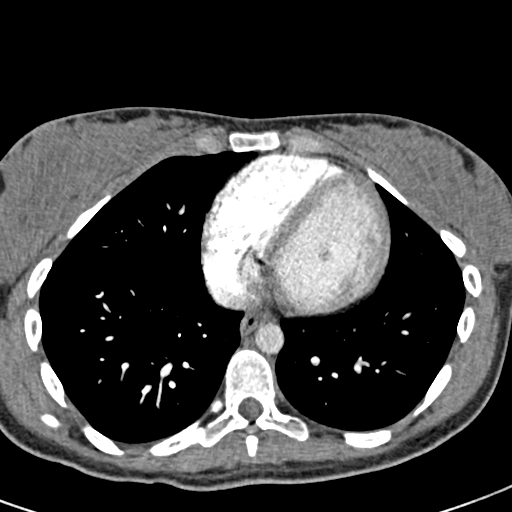
[im 102/260  lung]
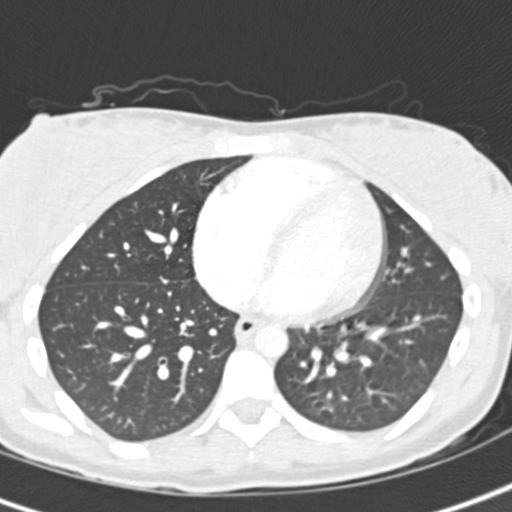
[im 124/260  soft-tissue]
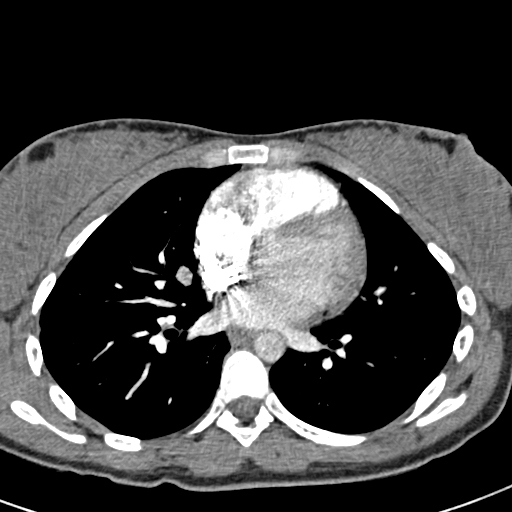
[im 136/260  lung]
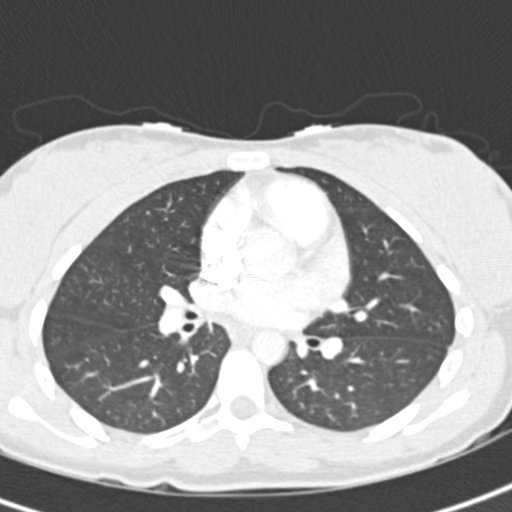
[im 158/260  soft-tissue]
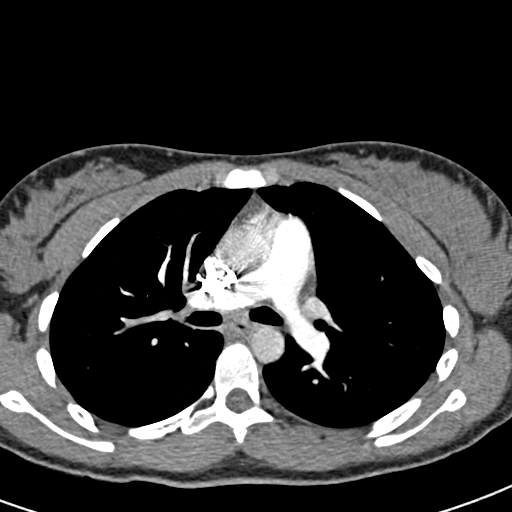
[im 169/260  lung]
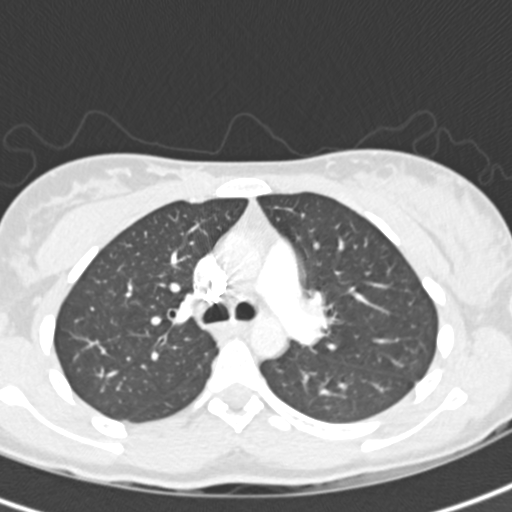
[im 181/260  soft-tissue]
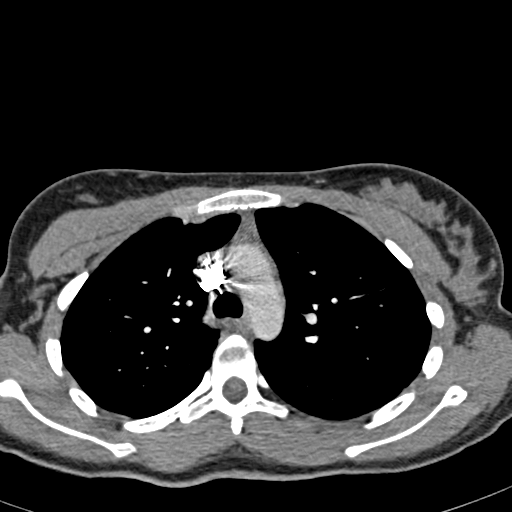
[im 203/260  lung]
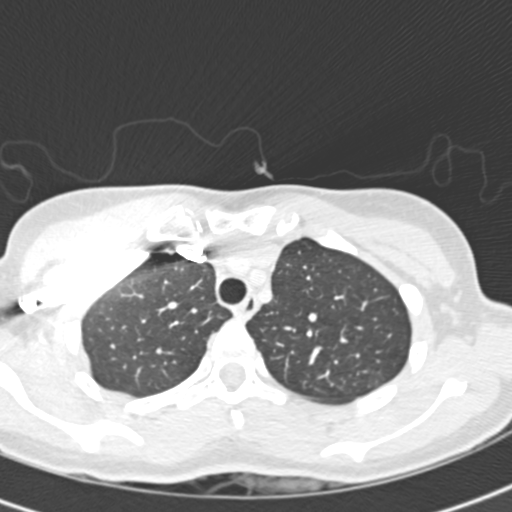
[im 214/260  soft-tissue]
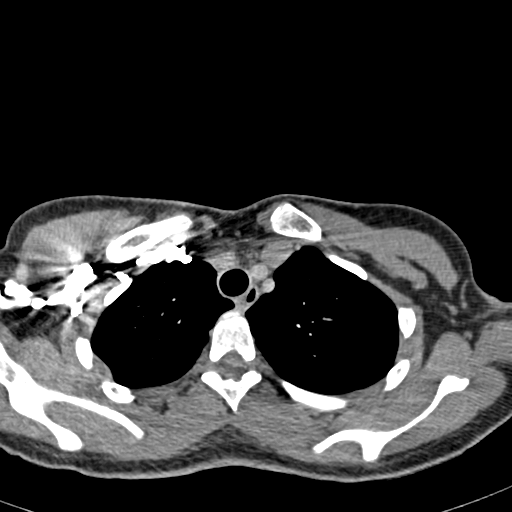
[im 226/260  lung]
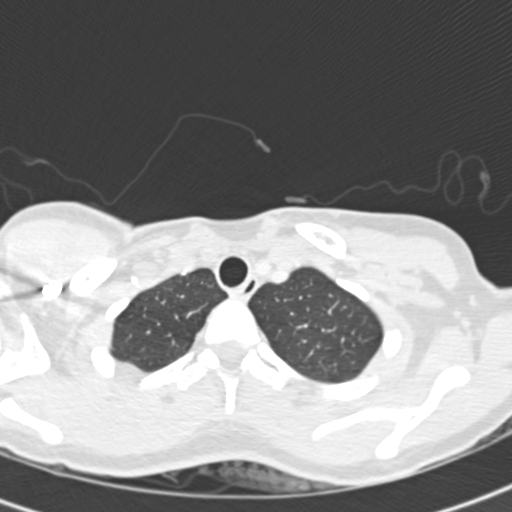
[im 248/260  soft-tissue]
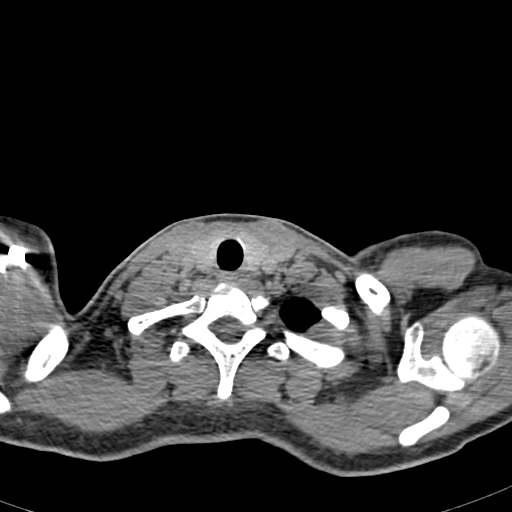

[Series 602: cor · coronal · 0.53mm/px · 3 of 91 slices shown]
[im 23/91  soft-tissue]
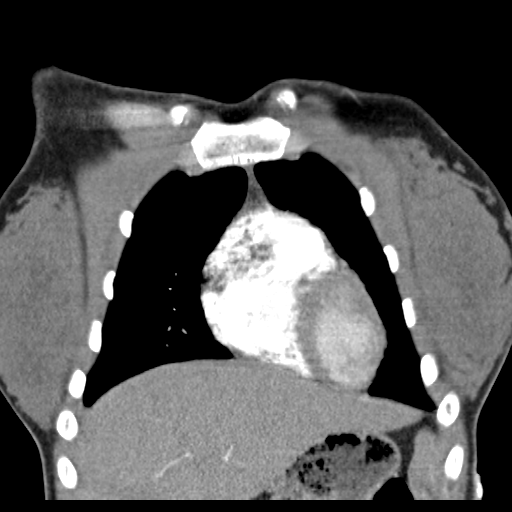
[im 46/91  soft-tissue]
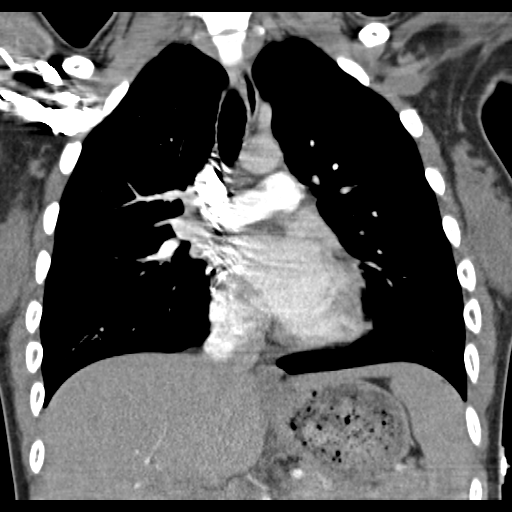
[im 68/91  soft-tissue]
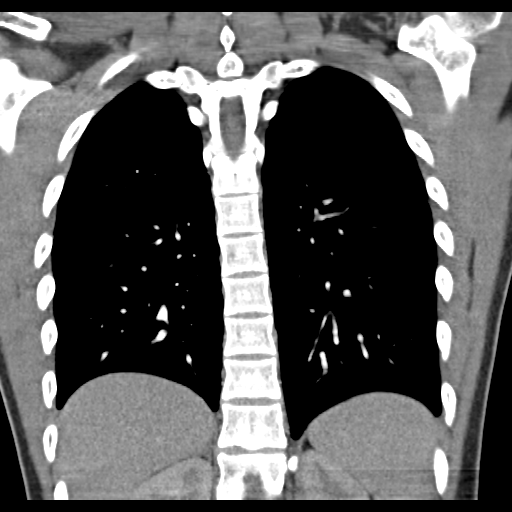

[19 of 46 positions shown; findings below may reference images not displayed]

FINDINGS: The patient is pregnant and was shielded for the scan.

Negative for pulmonary embolism.  Thoracic aorta is normal.

The lungs are clear without infiltrate or effusion.  No mass or
adenopathy is detected.

Review of the MIP images confirms the above findings.
IMPRESSION: Normal

## 2013-12-09 ENCOUNTER — Encounter (HOSPITAL_COMMUNITY): Payer: Self-pay | Admitting: Emergency Medicine

## 2016-04-08 ENCOUNTER — Ambulatory Visit: Payer: Self-pay | Admitting: Family Medicine

## 2016-04-21 NOTE — Progress Notes (Deleted)
      HPI:   Ms.Ramie T Leitha BleakHerring is a 31 y.o. female, who is here today to establish care with me.  Former PCP: *** Last preventive routine visit: ***  Chronic medical problems: Anemia.  Lab Results  Component Value Date   WBC 14.7 (H) 05/30/2011   HGB 9.6 (L) 05/30/2011   HCT 29.4 (L) 05/30/2011   MCV 86.7 05/30/2011   PLT 121 (L) 05/30/2011      Concerns today: ***     Review of Systems    Current Outpatient Prescriptions on File Prior to Visit  Medication Sig Dispense Refill  . medroxyPROGESTERone (DEPO-PROVERA) 150 MG/ML injection Inject 1 mL (150 mg total) into the muscle every 3 (three) months. 1 mL 4   No current facility-administered medications on file prior to visit.      Past Medical History:  Diagnosis Date  . Abnormal Pap smear 2008  . Chlamydia contact, treated 01/11/2011   Treated/ neg TOC  . Chlamydia contact, treated 01/2011   Treated/ neg TOC  . Chlamydia infection 2006  . Late prenatal care complicating pregnancy   . No pertinent past medical history   . Temporary low platelet count    at 28 weeks   No Known Allergies  Family History  Problem Relation Age of Onset  . Hypertension Mother   . Alcohol abuse Mother   . Hypertension Maternal Grandmother   . Anesthesia problems Neg Hx   . Hypotension Neg Hx   . Malignant hyperthermia Neg Hx   . Pseudochol deficiency Neg Hx     Social History   Social History  . Marital status: Single    Spouse name: N/A  . Number of children: N/A  . Years of education: N/A   Social History Main Topics  . Smoking status: Never Smoker  . Smokeless tobacco: Never Used  . Alcohol use No  . Drug use: No  . Sexual activity: Yes    Partners: Male    Birth control/ protection: None   Other Topics Concern  . Not on file   Social History Narrative  . No narrative on file    There were no vitals filed for this visit.  There is no height or weight on file to calculate  BMI.      Physical Exam    ASSESSMENT AND PLAN:     Diagnoses and all orders for this visit:  Iron deficiency anemia, unspecified iron deficiency anemia type                Kaspar Albornoz G. [place], MD  [facility]. [place] office.

## 2016-04-22 ENCOUNTER — Encounter: Payer: Self-pay | Admitting: Family Medicine

## 2016-04-22 VITALS — Ht 66.0 in

## 2016-04-22 NOTE — Progress Notes (Signed)
Pre visit review using our clinic review tool, if applicable. No additional management support is needed unless otherwise documented below in the visit note. 

## 2016-04-29 ENCOUNTER — Ambulatory Visit (INDEPENDENT_AMBULATORY_CARE_PROVIDER_SITE_OTHER): Payer: 59 | Admitting: Family Medicine

## 2016-04-29 ENCOUNTER — Encounter: Payer: Self-pay | Admitting: Family Medicine

## 2016-04-29 VITALS — BP 102/80 | HR 62 | Resp 12 | Ht 66.0 in | Wt 160.2 lb

## 2016-04-29 DIAGNOSIS — Z3042 Encounter for surveillance of injectable contraceptive: Secondary | ICD-10-CM | POA: Insufficient documentation

## 2016-04-29 DIAGNOSIS — R5383 Other fatigue: Secondary | ICD-10-CM | POA: Diagnosis not present

## 2016-04-29 DIAGNOSIS — D509 Iron deficiency anemia, unspecified: Secondary | ICD-10-CM

## 2016-04-29 DIAGNOSIS — R1030 Lower abdominal pain, unspecified: Secondary | ICD-10-CM

## 2016-04-29 LAB — CBC WITH DIFFERENTIAL/PLATELET
Basophils Absolute: 0 10*3/uL (ref 0.0–0.1)
Basophils Relative: 0.3 % (ref 0.0–3.0)
EOS ABS: 0.1 10*3/uL (ref 0.0–0.7)
EOS PCT: 3.1 % (ref 0.0–5.0)
HEMATOCRIT: 40.2 % (ref 36.0–46.0)
HEMOGLOBIN: 13.8 g/dL (ref 12.0–15.0)
LYMPHS PCT: 40.2 % (ref 12.0–46.0)
Lymphs Abs: 1.7 10*3/uL (ref 0.7–4.0)
MCHC: 34.2 g/dL (ref 30.0–36.0)
MCV: 89.1 fl (ref 78.0–100.0)
MONO ABS: 0.3 10*3/uL (ref 0.1–1.0)
Monocytes Relative: 7.9 % (ref 3.0–12.0)
Neutro Abs: 2.1 10*3/uL (ref 1.4–7.7)
Neutrophils Relative %: 48.5 % (ref 43.0–77.0)
Platelets: 172 10*3/uL (ref 150.0–400.0)
RBC: 4.52 Mil/uL (ref 3.87–5.11)
RDW: 13.6 % (ref 11.5–15.5)
WBC: 4.3 10*3/uL (ref 4.0–10.5)

## 2016-04-29 LAB — URINALYSIS, ROUTINE W REFLEX MICROSCOPIC
BILIRUBIN URINE: NEGATIVE
Nitrite: NEGATIVE
Specific Gravity, Urine: 1.025 (ref 1.000–1.030)
Total Protein, Urine: NEGATIVE
URINE GLUCOSE: NEGATIVE
UROBILINOGEN UA: 0.2 (ref 0.0–1.0)
pH: 6 (ref 5.0–8.0)

## 2016-04-29 LAB — BASIC METABOLIC PANEL
BUN: 12 mg/dL (ref 6–23)
CO2: 27 meq/L (ref 19–32)
Calcium: 9.2 mg/dL (ref 8.4–10.5)
Chloride: 105 mEq/L (ref 96–112)
Creatinine, Ser: 0.86 mg/dL (ref 0.40–1.20)
GFR: 100.74 mL/min (ref >60.00)
GLUCOSE: 92 mg/dL (ref 70–99)
POTASSIUM: 3.6 meq/L (ref 3.5–5.1)
SODIUM: 140 meq/L (ref 135–145)

## 2016-04-29 LAB — POCT URINE PREGNANCY: Preg Test, Ur: NEGATIVE

## 2016-04-29 LAB — TSH: TSH: 1.5 u[IU]/mL (ref 0.35–4.50)

## 2016-04-29 MED ORDER — MEDROXYPROGESTERONE ACETATE 150 MG/ML IM SUSP
150.0000 mg | Freq: Once | INTRAMUSCULAR | Status: AC
  Administered 2016-04-29: 150 mg via INTRAMUSCULAR

## 2016-04-29 NOTE — Progress Notes (Signed)
Pre visit review using our clinic review tool, if applicable. No additional management support is needed unless otherwise documented below in the visit note. 

## 2016-04-29 NOTE — Patient Instructions (Addendum)
A few things to remember from today's visit:   Other fatigue - Plan: TSH, CBC with Differential/Platelet, Basic metabolic panel, POCT urine pregnancy  Lower abdominal pain - Plan: CBC with Differential/Platelet, Basic metabolic panel, Urinalysis, Routine w reflex microscopic  Encounter for Depo-Provera contraception - Plan: POCT urine pregnancy  Iron deficiency anemia, unspecified iron deficiency anemia type - Plan: CBC with Differential/Platelet  Please do a pregnancy test at home in 2 weeks.  Fatigue is a common symptom associated with multiple factors: psychologic,medications, systemic illness, sleep disorders,infections, and unknown causes. Some work-up can be done to evaluate for common causes as thyroid disease,anemia,diabetes, or abnormalities in calcium,potassium,or sodium. Regular physical activity as tolerated and a healthy diet is usually might help and usually recommended for chronic fatigue.   Pregnancy test today was negative.   Please be sure medication list is accurate. If a new problem present, please set up appointment sooner than planned today.

## 2016-04-29 NOTE — Progress Notes (Signed)
HPI:   [name] is a 31 y.o. female, who is here today to establish care with me.  Former PCP: Dr Joanna Shaw. Last preventive routine visit: 3-4 years ago.  Chronic medical problems: Anemia.  Lab Results  Component Value Date   WBC 14.7 (H) 05/30/2011   HGB 9.6 (L) 05/30/2011   HCT 29.4 (L) 05/30/2011   MCV 86.7 05/30/2011   PLT 121 (L) 05/30/2011      Concerns today: Fatigue,"pink urine", Depo Provera.  Complaining of a month of fatigue, she denies prior history. She denies history of anxiety, depression,or insomnia. States that she sleeps "good" No unusual stress level or working hours. Hx of anemia, she is not on Iron supplementation. Denies pica.  -5 days of lower abdominal cramping-like pain, mild, intermittently, not radiated. She denies associated fever, chills, nausea, vomiting, changes in bowel habits, dysuria, vaginal discharge or bleeding. She has noted "pink" urine. She has not noted alleviating or exacerbating factors.  She is not sure about her last LMP, she is on Depo-Provera, she is not sure about when her last injection was, she states that usually when she is due for next injection she usually has abdominal cramps and minimal vaginal spotting.  She has been on Depo-Provera for contraception for about 4 years. She denies any side effect. In the past she was on OCPs, they caused nausea.  Reporting HIV test done with her last pregnancy, she denies risk factors, she is not interested in having it done. She is sexually active, last sex intercourse about 2 days ago. Hx of abnormal Pap smear, she has not had one in 3-4 years.    Review of Systems  Constitutional: Positive for fatigue. Negative for activity change, appetite change, fever and unexpected weight change.  HENT: Negative for mouth sores, nosebleeds and trouble swallowing.   Eyes: Negative for redness and visual disturbance.  Respiratory: Negative for cough, shortness of  breath and wheezing.   Cardiovascular: Negative for chest pain, palpitations and leg swelling.  Gastrointestinal: Positive for abdominal pain. Negative for blood in stool, nausea and vomiting.       Negative for changes in bowel habits.  Endocrine: Negative for cold intolerance and heat intolerance.  Genitourinary: Positive for pelvic pain. Negative for decreased urine volume, difficulty urinating, dysuria, flank pain, vaginal bleeding and vaginal discharge.  Musculoskeletal: Negative for arthralgias, back pain and myalgias.  Skin: Negative for pallor and rash.  Neurological: Negative for syncope, weakness and headaches.  Hematological: Negative for adenopathy. Does not bruise/bleed easily.  Psychiatric/Behavioral: Negative for confusion and sleep disturbance. The patient is not nervous/anxious.       Current Outpatient Prescriptions on File Prior to Visit  Medication Sig Dispense Refill  . medroxyPROGESTERone (DEPO-PROVERA) 150 MG/ML injection Inject 1 mL (150 mg total) into the muscle every 3 (three) months. 1 mL 4   No current facility-administered medications on file prior to visit.      Past Medical History:  Diagnosis Date  . Abnormal Pap smear 2008  . Chlamydia contact, treated 01/11/2011   Treated/ neg TOC  . Chlamydia contact, treated 01/2011   Treated/ neg TOC  . Chlamydia infection 2006  . Late prenatal care complicating pregnancy   . No pertinent past medical history   . Temporary low platelet count (HCC)    at 28 weeks   No Known Allergies  Family History  Problem Relation Age of Onset  . Hypertension Mother   . Alcohol abuse Mother   .  Hypertension Maternal Grandmother   . Anesthesia problems Neg Hx   . Hypotension Neg Hx   . Malignant hyperthermia Neg Hx   . Pseudochol deficiency Neg Hx     Social History   Social History  . Marital status: Single    Spouse name: N/A  . Number of children: N/A  . Years of education: N/A   Social History Main  Topics  . Smoking status: Never Smoker  . Smokeless tobacco: Never Used  . Alcohol use No  . Drug use: No  . Sexual activity: Yes    Partners: Male    Birth control/ protection: None   Other Topics Concern  . None   Social History Narrative  . None    Vitals:   04/29/16 1129  BP: 102/80  Pulse: 62  Resp: 12   O2 sat 98% at RA. Body mass index is 25.87 kg/m.   Physical Exam  Constitutional: She is oriented to person, place, and time. She appears well-developed. No distress.  HENT:  Head: Atraumatic.  Mouth/Throat: Oropharynx is clear and moist and mucous membranes are normal.  Eyes: Conjunctivae and EOM are normal. Pupils are equal, round, and reactive to light.  Neck: No tracheal deviation present. Thyromegaly (Mild) present. No thyroid mass present.  Cardiovascular: Normal rate and regular rhythm.   No murmur heard. Pulses:      Dorsalis pedis pulses are 2+ on the right side, and 2+ on the left side.  Respiratory: Effort normal and breath sounds normal. No respiratory distress.  GI: Soft. She exhibits no mass. There is no hepatomegaly. There is no tenderness.  Musculoskeletal: She exhibits no edema.  Lymphadenopathy:    She has cervical adenopathy.       Right cervical: Posterior cervical adenopathy present.       Right: No supraclavicular adenopathy present.       Left: No supraclavicular adenopathy present.  posterior cervical lymph nodes palpable, 1-2 cm, mobile,no tender.  Neurological: She is alert and oriented to person, place, and time. She has normal strength. Coordination normal.  Skin: Skin is warm. No rash noted. No erythema.  Psychiatric: She has a normal mood and affect.  Well groomed, good eye contact.     ASSESSMENT AND PLAN:   Joanna Shaw was seen today for establish care.  Diagnoses and all orders for this visit:  Other fatigue  We discussed possible causes, it can be transient. ? Related to anemia. Further recommendations would be  given according to lab results.  -     TSH -     CBC with Differential/Platelet -     Basic metabolic panel -     POCT urine pregnancy  Lower abdominal pain  Examination today negative. ? Dysmenorrhea. Pregnancy test negative. Instructed about warning signs.  -     CBC with Differential/Platelet -     Basic metabolic panel -     Urinalysis, Routine w reflex microscopic  Iron deficiency anemia, unspecified iron deficiency anemia type  For the recommendations would be given according to lab results.  -     CBC with Differential/Platelet  Encounter for Depo-Provera contraception  She called former PCP, according to patient her last Depo-Provera injection was in November 2017. We discussed some side effects of Depo-Provera with chronic use. We discussed other birth control options, she is not interested for now. . Today pregnancy test was negative, explained that this does not rule out pregnancy,since she has been sexually active in eh past 2  weeks. We could bring her back to repeat pregnancy test in 2 weeks then Depo Provera if negative OR we can given it today. She understands risk, prefers to get her Depo Provera injection today. Instructed to repeat pregnancy test at home in about 2 weeks. Adequate calcium intake and regular exercise recommended. She needs to schedule a pap smear.  -     POCT urine pregnancy -     medroxyPROGESTERone (DEPO-PROVERA) injection 150 mg; Inject 1 mL (150 mg total) into the muscle once.     Zyona Pettaway G. [place], MD  [facility]. [place] office.

## 2016-07-29 ENCOUNTER — Ambulatory Visit: Payer: 59

## 2016-08-12 ENCOUNTER — Ambulatory Visit: Payer: 59 | Admitting: *Deleted

## 2016-08-12 MED ORDER — MEDROXYPROGESTERONE ACETATE 150 MG/ML IM SUSP: 150.0000 mg | Freq: Once | INTRAMUSCULAR | Status: DC

## 2016-08-12 NOTE — Progress Notes (Signed)
Patient last received depo provera 04/29/16; due to it being 15 weeks since injection; urine pregnancy test needed; patient then reported that she would have to reschedule visit without leaving urine sample.

## 2016-08-19 ENCOUNTER — Ambulatory Visit (INDEPENDENT_AMBULATORY_CARE_PROVIDER_SITE_OTHER): Payer: 59 | Admitting: *Deleted

## 2016-08-19 DIAGNOSIS — Z309 Encounter for contraceptive management, unspecified: Secondary | ICD-10-CM

## 2016-08-19 LAB — POCT URINE PREGNANCY: PREG TEST UR: NEGATIVE

## 2016-08-19 MED ORDER — MEDROXYPROGESTERONE ACETATE 150 MG/ML IM SUSP
150.0000 mg | Freq: Once | INTRAMUSCULAR | Status: AC
  Administered 2016-08-19: 150 mg via INTRAMUSCULAR

## 2016-08-19 NOTE — Progress Notes (Signed)
Patient seen for contraception management; patient overdue for depo provera last injection 04/29/16; due to being 16 weeks since last injection; urine pregnancy test ordered per PCP; test resulted negative; administered depo provera IM to rt deltoid; patient tolerated well; no s/s of reactions; patient instructed to maintain q 12 week injection schedule; understanding voiced.

## 2016-11-11 ENCOUNTER — Ambulatory Visit (INDEPENDENT_AMBULATORY_CARE_PROVIDER_SITE_OTHER): Payer: 59

## 2016-11-11 DIAGNOSIS — Z309 Encounter for contraceptive management, unspecified: Secondary | ICD-10-CM

## 2016-11-11 MED ORDER — MEDROXYPROGESTERONE ACETATE 150 MG/ML IM SUSP
150.0000 mg | Freq: Once | INTRAMUSCULAR | Status: AC
  Administered 2016-11-11: 150 mg via INTRAMUSCULAR

## 2016-11-11 MED ORDER — MEDROXYPROGESTERONE ACETATE 150 MG/ML IM SUSP: 150.0000 mg | Freq: Once | INTRAMUSCULAR | Status: DC

## 2017-01-20 ENCOUNTER — Ambulatory Visit (INDEPENDENT_AMBULATORY_CARE_PROVIDER_SITE_OTHER): Payer: 59

## 2017-01-20 DIAGNOSIS — Z309 Encounter for contraceptive management, unspecified: Secondary | ICD-10-CM

## 2017-01-20 MED ORDER — MEDROXYPROGESTERONE ACETATE 150 MG/ML IM SUSP
150.0000 mg | Freq: Once | INTRAMUSCULAR | Status: AC
  Administered 2017-01-20: 150 mg via INTRAMUSCULAR

## 2017-02-16 NOTE — Progress Notes (Signed)
HPI:   [name] is a 32 y.o. female, who is here today for her routine physical.  Last CPE: 03/2013.  Regular exercise 3 or more time per week: Not consistently. Following a healthy diet: Yes. She lives with her 31 yo daughter.  Chronic medical problems: Anemia,,fatigue  Lab Results  Component Value Date   WBC 4.3 04/29/2016   HGB 13.8 04/29/2016   HCT 40.2 04/29/2016   MCV 89.1 04/29/2016   PLT 172.0 04/29/2016   Pap smear 2012. Hx of abnormal pap smears: LGSIL Last one normal. Hx of STD's Treated for chlamydia about 7 years ago.  M: 12 G: 1 L:1  LMP 1-2 weeks ago vaginal spotting,which usually occurs after her Depo Provera injection.  She is sexually active. Has been on Depo-Provera for about 12 years.   Immunization History  Administered Date(s) Administered  . Td 02/13/2005  . Tdap 05/30/2011     She has no concerns today.   Review of Systems  Constitutional: Positive for fatigue. Negative for appetite change, fever and unexpected weight change.  HENT: Negative for dental problem, hearing loss, nosebleeds, sore throat, trouble swallowing and voice change.   Eyes: Negative for redness and visual disturbance.  Respiratory: Negative for cough, shortness of breath and wheezing.   Cardiovascular: Negative for chest pain and leg swelling.  Gastrointestinal: Negative for abdominal pain, blood in stool, nausea and vomiting.       No changes in bowel habits.  Endocrine: Negative for cold intolerance, heat intolerance, polydipsia, polyphagia and polyuria.  Genitourinary: Negative for decreased urine volume, dysuria, hematuria, menstrual problem, vaginal bleeding and vaginal discharge.       No breast tenderness or nipple discharge.  Musculoskeletal: Negative for arthralgias, back pain, gait problem and myalgias.  Skin: Negative for pallor and rash.  Neurological: Negative for syncope, weakness, numbness and headaches.  Hematological:  Negative for adenopathy. Does not bruise/bleed easily.  Psychiatric/Behavioral: Negative for confusion and sleep disturbance. The patient is not nervous/anxious.   All other systems reviewed and are negative.     Current Outpatient Medications on File Prior to Visit  Medication Sig Dispense Refill  . medroxyPROGESTERone (DEPO-PROVERA) 150 MG/ML injection Inject 1 mL (150 mg total) into the muscle every 3 (three) months. 1 mL 4   No current facility-administered medications on file prior to visit.      Past Medical History:  Diagnosis Date  . Abnormal Pap smear 2008  . Chlamydia contact, treated 01/11/2011   Treated/ neg TOC  . Chlamydia contact, treated 01/2011   Treated/ neg TOC  . Chlamydia infection 2006  . Late prenatal care complicating pregnancy   . No pertinent past medical history   . Temporary low platelet count (HCC)    at 28 weeks    Past Surgical History:  Procedure Laterality Date  . CESAREAN SECTION  05/29/2011   Procedure: CESAREAN SECTION;  Surgeon: Purcell Nails, MD;  Location: [facility];  Service: Gynecology;  Laterality: N/A;  Primar cesarean section of baby  girl at 2043 APGAR  . NO PAST SURGERIES      No Known Allergies  Family History  Problem Relation Age of Onset  . Hypertension Mother   . Alcohol abuse Mother   . Hypertension Maternal Grandmother   . Anesthesia problems Neg Hx   . Hypotension Neg Hx   . Malignant hyperthermia Neg Hx   . Pseudochol deficiency Neg Hx     Social History  Socioeconomic History  . Marital status: Single    Spouse name: None  . Number of children: None  . Years of education: None  . Highest education level: None  Social Needs  . Financial resource strain: None  . Food insecurity - worry: None  . Food insecurity - inability: None  . Transportation needs - medical: None  . Transportation needs - non-medical: None  Occupational History  . None  Tobacco Use  . Smoking status: Never Smoker  . Smokeless  tobacco: Never Used  Substance and Sexual Activity  . Alcohol use: No  . Drug use: No  . Sexual activity: Yes    Partners: Male    Birth control/protection: None  Other Topics Concern  . None  Social History Narrative  . None     Vitals:   02/17/17 1332  BP: 112/72  Pulse: 74  Resp: 12  Temp: 98.9 F (37.2 C)  SpO2: 100%   Body mass index is 25.18 kg/m.   Wt Readings from Last 3 Encounters:  02/17/17 156 lb (70.8 kg)  04/29/16 160 lb 4 oz (72.7 kg)  07/06/11 132 lb (59.9 kg)    Physical Exam  Nursing note and vitals reviewed. Constitutional: She is oriented to person, place, and time. She appears well-developed and well-nourished. No distress.  HENT:  Head: Normocephalic and atraumatic.  Right Ear: Hearing, tympanic membrane, external ear and ear canal normal.  Left Ear: Hearing, tympanic membrane, external ear and ear canal normal.  Mouth/Throat: Uvula is midline, oropharynx is clear and moist and mucous membranes are normal.  Eyes: Conjunctivae and EOM are normal. Pupils are equal, round, and reactive to light.  Neck: No tracheal deviation present. No thyroid mass and no thyromegaly (palpable) present.  Cardiovascular: Normal rate and regular rhythm.  No murmur heard. Pulses:      Dorsalis pedis pulses are 2+ on the right side, and 2+ on the left side.  Respiratory: Effort normal and breath sounds normal. No respiratory distress.  GI: Soft. She exhibits no mass. There is no hepatomegaly. There is no tenderness. Hernia confirmed negative in the right inguinal area and confirmed negative in the left inguinal area.  Genitourinary: Uterus normal. There is no rash, tenderness or lesion on the right labia. There is no rash, tenderness or lesion on the left labia. Cervix exhibits discharge. Cervix exhibits no motion tenderness and no friability. Right adnexum displays no mass, no tenderness and no fullness. Left adnexum displays no mass, no tenderness and no fullness. No  erythema, tenderness or bleeding in the vagina. Vaginal discharge found.  Genitourinary Comments: Breast: No masses, no skin changes, or nipple discharge bilateral.  Mild fibrocystic changes appreciated on other upper quadrant bilaterally.  Pap smear collected.  Musculoskeletal: She exhibits no edema or tenderness.  No major deformity or signs of synovitis appreciated.  Lymphadenopathy:    She has no cervical adenopathy.       Right: No inguinal and no supraclavicular adenopathy present.       Left: No inguinal and no supraclavicular adenopathy present.  Neurological: She is alert and oriented to person, place, and time. She has normal strength. No cranial nerve deficit. Coordination and gait normal.  Reflex Scores:      Bicep reflexes are 2+ on the right side and 2+ on the left side.      Patellar reflexes are 2+ on the right side and 2+ on the left side. Skin: Skin is warm. No rash noted. No erythema.  Psychiatric: She has a normal mood and affect. Cognition and memory are normal.  Well groomed, good eye contact.    ASSESSMENT AND PLAN:   Ms. Margretta Dittyiquorise was seen today for annual exam.  Diagnoses and all orders for this visit:  Routine general medical examination at a health care facility  We discussed the importance of regular physical activity and healthy diet for prevention of chronic illness and/or complications. Preventive guidelines reviewed. Vaccination up-to-date.  She is not interested in flu shot today. STD prevention discussed. Next CPE in a year.  Iron deficiency anemia, unspecified iron deficiency anemia type  Further recommendation will be given according to lab results.  -     CBC -     Ferritin -     Iron  Cervical cancer screening -     Cytology - PAP ([place])  Screen for STD (sexually transmitted disease) -     Cytology - PAP ([place])   Encounter for Depo-Provera contraception  Educated about side effects of Depo-Provera. She is not  interested in trying another birth control method. Adequate calcium intake and physical activity is recommended.        Return in 1 year (on 02/17/2018) for routine.      Zamyra Allensworth G. [place], MD  [facility]. [place] office.

## 2017-02-17 ENCOUNTER — Encounter: Payer: Self-pay | Admitting: Family Medicine

## 2017-02-17 ENCOUNTER — Ambulatory Visit (INDEPENDENT_AMBULATORY_CARE_PROVIDER_SITE_OTHER): Payer: 59 | Admitting: Family Medicine

## 2017-02-17 ENCOUNTER — Other Ambulatory Visit (HOSPITAL_COMMUNITY)
Admission: RE | Admit: 2017-02-17 | Discharge: 2017-02-17 | Disposition: A | Discharge status: Home / Self Care | Payer: 59 | Source: Ambulatory Visit | Attending: Family Medicine | Admitting: Family Medicine

## 2017-02-17 VITALS — BP 112/72 | HR 74 | Temp 98.9°F | Resp 12 | Ht 66.0 in | Wt 156.0 lb

## 2017-02-17 DIAGNOSIS — Z113 Encounter for screening for infections with a predominantly sexual mode of transmission: Secondary | ICD-10-CM | POA: Diagnosis not present

## 2017-02-17 DIAGNOSIS — Z124 Encounter for screening for malignant neoplasm of cervix: Secondary | ICD-10-CM | POA: Diagnosis not present

## 2017-02-17 DIAGNOSIS — D509 Iron deficiency anemia, unspecified: Secondary | ICD-10-CM

## 2017-02-17 DIAGNOSIS — Z3042 Encounter for surveillance of injectable contraceptive: Secondary | ICD-10-CM

## 2017-02-17 DIAGNOSIS — Z0000 Encounter for general adult medical examination without abnormal findings: Secondary | ICD-10-CM

## 2017-02-17 LAB — CBC
HCT: 42.6 % (ref 36.0–46.0)
HEMOGLOBIN: 14.4 g/dL (ref 12.0–15.0)
MCHC: 33.8 g/dL (ref 30.0–36.0)
MCV: 92.1 fl (ref 78.0–100.0)
PLATELETS: 173 10*3/uL (ref 150.0–400.0)
RBC: 4.62 Mil/uL (ref 3.87–5.11)
RDW: 13.1 % (ref 11.5–15.5)
WBC: 5.1 10*3/uL (ref 4.0–10.5)

## 2017-02-17 LAB — FERRITIN: Ferritin: 54.3 ng/mL (ref 10.0–291.0)

## 2017-02-17 LAB — IRON: IRON: 71 ug/dL (ref 42–145)

## 2017-02-17 NOTE — Patient Instructions (Addendum)
A few things to remember from today's visit:   Routine general medical examination at a health care facility  Iron deficiency anemia, unspecified iron deficiency anemia type - Plan: CBC, Ferritin  Cervical cancer screening - Plan: Cytology - PAP ([place])  Screen for STD (sexually transmitted disease) - Plan: Cytology - PAP ([place])  Today you have you routine preventive visit.  At least 150 minutes of moderate exercise per week, daily brisk walking for 15-30 min is a good exercise option. Healthy diet low in saturated (animal) fats and sweets and consisting of fresh fruits and vegetables, lean meats such as fish and white chicken and whole grains.  These are some of recommendations for screening depending of age and risk factors:   - Vaccines:  Tdap vaccine every 10 years.  Shingles vaccine recommended at age 30, could be given after 30 years of age but not sure about insurance coverage.   Pneumonia vaccines:  Prevnar 13 at 65 and Pneumovax at 66. Sometimes Pneumovax is giving earlier if history of smoking, lung disease,diabetes,kidney disease among some.    Screening for diabetes at age 30 and every 3 years.  Cervical cancer prevention:  Pap smear starts at 30 years of age and continues periodically until 30 years old in low risk women. Pap smear every 3 years between 8321 and 30 years old. Pap smear every 3-5 years between women 30 and older if pap smear negative and HPV screening negative.   -Breast cancer: Mammogram: There is disagreement between experts about when to start screening in low risk asymptomatic female but recent recommendations are to start screening at 8740 and not later than 30 years old , every 1-2 years and after 30 yo q 2 years. Screening is recommended until 30 years old but some women can continue screening depending of healthy issues.   Colon cancer screening: starts at 30 years old until 30 years old.  Cholesterol disorder screening at age 145  and every 3 years.  Also recommended:  1. Dental visit- Brush and floss your teeth twice daily; visit your dentist twice a year. 2. Eye doctor- Get an eye exam at least every 2 years. 3. Helmet use- Always wear a helmet when riding a bicycle, motorcycle, rollerblading or skateboarding. 4. Safe sex- If you may be exposed to sexually transmitted infections, use a condom. 5. Seat belts- Seat belts can save your live; always wear one. 6. Smoke/Carbon Monoxide detectors- These detectors need to be installed on the appropriate level of your home. Replace batteries at least once a year. 7. Skin cancer- When out in the sun please cover up and use sunscreen 15 SPF or higher. 8. Violence- If anyone is threatening or hurting you, please tell your healthcare provider.  9. Drink alcohol in moderation- Limit alcohol intake to one drink or less per day. Never drink and drive.  Please be sure medication list is accurate. If a new problem present, please set up appointment sooner than planned today.

## 2017-02-22 LAB — CYTOLOGY - PAP
Chlamydia: POSITIVE — AB
DIAGNOSIS: NEGATIVE
Diagnosis: REACTIVE
Neisseria Gonorrhea: NEGATIVE
TRICH (WINDOWPATH): NEGATIVE

## 2017-02-24 ENCOUNTER — Telehealth: Payer: Self-pay | Admitting: Family Medicine

## 2017-02-24 ENCOUNTER — Other Ambulatory Visit: Payer: Self-pay | Admitting: Family Medicine

## 2017-02-24 ENCOUNTER — Other Ambulatory Visit: Payer: Self-pay | Admitting: *Deleted

## 2017-02-24 DIAGNOSIS — A749 Chlamydial infection, unspecified: Secondary | ICD-10-CM

## 2017-02-24 MED ORDER — AZITHROMYCIN 1 G PO PACK: 1.0000 g | PACK | Freq: Once | ORAL | 0 refills | Status: DC

## 2017-02-24 NOTE — Telephone Encounter (Signed)
Copied from CRM [phone]. Topic: Quick Communication - See Telephone Encounter >> Feb 24, 2017  2:16 PM Terisa Starraylor, Brittany L wrote: CRM for notification. See Telephone encounter for:   02/24/17.  Patient said she has questions about her lab results on Friday. Please call back @ [phone]

## 2017-02-24 NOTE — Telephone Encounter (Signed)
Spoke with patient. Patient verbalized understanding.

## 2017-02-27 NOTE — Telephone Encounter (Signed)
Pt called in to request instructions/directions on how to take her medication Azithriomycin 500mg .    Please advise.  CB [phone]

## 2017-02-28 NOTE — Telephone Encounter (Signed)
Spoke with patient and patient verbalized understanding  

## 2017-04-26 ENCOUNTER — Encounter: Payer: Self-pay | Admitting: Family Medicine

## 2017-04-26 ENCOUNTER — Ambulatory Visit (INDEPENDENT_AMBULATORY_CARE_PROVIDER_SITE_OTHER): Payer: 59 | Admitting: Family Medicine

## 2017-04-26 VITALS — BP 118/72 | HR 80 | Temp 98.8°F | Resp 12 | Ht 66.0 in | Wt 157.0 lb

## 2017-04-26 DIAGNOSIS — R202 Paresthesia of skin: Secondary | ICD-10-CM

## 2017-04-26 DIAGNOSIS — M79605 Pain in left leg: Secondary | ICD-10-CM

## 2017-04-26 DIAGNOSIS — F411 Generalized anxiety disorder: Secondary | ICD-10-CM

## 2017-04-26 DIAGNOSIS — R200 Anesthesia of skin: Secondary | ICD-10-CM | POA: Diagnosis not present

## 2017-04-26 MED ORDER — ESCITALOPRAM OXALATE 10 MG PO TABS: 10.0000 mg | ORAL_TABLET | Freq: Every day | ORAL | 1 refills | Status: DC

## 2017-04-26 NOTE — Patient Instructions (Signed)
A few things to remember from today's visit:   Left leg pain - Plan: DG Lumbar Spine Complete  Generalized anxiety disorder  Numbness and tingling of left leg - Plan: DG Lumbar Spine Complete, Vitamin B12, Basic metabolic panel  Today X ray was ordered.  This [facility] be done at [facility] at [address] between 8 am and 5 pm: [address] [phone].  Today we started Lexapro, this type of medications can increase suicidal risk. This is more prevalent among children,adolecents, and young adults with major depression or other psychiatric disorders. It can also make depression worse. Most common side effects are gastrointestinal, self limited after a few weeks: diarrhea, nausea, constipation  Or diarrhea among some.  In general it is well tolerated. We will follow closely.      Please be sure medication list is accurate. If a new problem present, please set up appointment sooner than planned today.

## 2017-04-26 NOTE — Progress Notes (Signed)
ACUTE VISIT   HPI:  Chief Complaint  Patient presents with  . Discuss concerns    wants FMLA paperwork filled out. Pt stated that she has other issues she wanted to discuss with PCP    [name] is a 32 y.o. female, who is here today requesting retrospective FMLA form completed.  According to pt,she has been doing counseling for anxiety and depression, which she thinks is affecting her job. She has been 1-2 hours late for work for the past few weeks and according to pt, she dropped form to her counselor but she was told it has to be completed by her PCP.  She tells me that she will lose her job if this form is not completed justifying the time she missed from 03/2017 to date.  I saw [name] for her CPE on 02/17/17. We have not discussed this problem in the past.  She feels like anxiety is getting worse. + Insomnia,having difficulty falling and staying asleep. Sleeps about 3-4 hours.  "Not functioning" at work and forgetfulness, not keeping up with her assignments. This has been going on for about 1-2 years. Having panic attacks, feeling nervous and frustrated, "sad." She denies suicidal thoughts. FHx for psychiatric disorders reported as negative. She has not taken medications and has not been in the ER or hospitalized for psychiatric Dx.    She is also complaining of left lower extremity pain "for a while" lateral aspect from hip to foot, intermittent, it seems to be worse when she is in bed and alleviated by massaging area behind the knee. 3-4 months of numbness and tingling sensation lateral aspect of LLE. She has no noted calf or back pain. She has not tried OTC medication.  No saddle anesthesia or urine/bowel incontinence.   Review of Systems  Constitutional: Positive for fatigue. Negative for activity change, appetite change and unexpected weight change.  Respiratory: Negative for chest tightness, shortness of breath and wheezing.     Cardiovascular: Negative for chest pain, palpitations and leg swelling.  Gastrointestinal: Negative for abdominal pain, nausea and vomiting.       No changes in bowel habits.  Endocrine: Negative for cold intolerance and heat intolerance.  Genitourinary: Negative for decreased urine volume, dysuria and hematuria.  Musculoskeletal: Positive for myalgias. Negative for back pain and gait problem.  Skin: Negative for rash and wound.  Neurological: Positive for numbness. Negative for dizziness, tremors, seizures, weakness and headaches.  Hematological: Negative for adenopathy. Does not bruise/bleed easily.  Psychiatric/Behavioral: Positive for sleep disturbance. Negative for confusion and suicidal ideas. The patient is nervous/anxious.       Current Outpatient Medications on File Prior to Visit  Medication Sig Dispense Refill  . medroxyPROGESTERone (DEPO-PROVERA) 150 MG/ML injection Inject 1 mL (150 mg total) into the muscle every 3 (three) months. 1 mL 4   No current facility-administered medications on file prior to visit.      Past Medical History:  Diagnosis Date  . Abnormal Pap smear 2008  . Chlamydia contact, treated 01/11/2011   Treated/ neg TOC  . Chlamydia contact, treated 01/2011   Treated/ neg TOC  . Chlamydia infection 2006  . Late prenatal care complicating pregnancy   . No pertinent past medical history   . Temporary low platelet count (HCC)    at 28 weeks   No Known Allergies  Social History   Socioeconomic History  . Marital status: Single    Spouse name: Not on file  .  Number of children: Not on file  . Years of education: Not on file  . Highest education level: Not on file  Occupational History  . Not on file  Social Needs  . Financial resource strain: Not on file  . Food insecurity:    Worry: Not on file    Inability: Not on file  . Transportation needs:    Medical: Not on file    Non-medical: Not on file  Tobacco Use  . Smoking status: Never  Smoker  . Smokeless tobacco: Never Used  Substance and Sexual Activity  . Alcohol use: No  . Drug use: No  . Sexual activity: Yes    Partners: Male    Birth control/protection: None  Lifestyle  . Physical activity:    Days per week: Not on file    Minutes per session: Not on file  . Stress: Not on file  Relationships  . Social connections:    Talks on phone: Not on file    Gets together: Not on file    Attends religious service: Not on file    Active member of club or organization: Not on file    Attends meetings of clubs or organizations: Not on file    Relationship status: Not on file  Other Topics Concern  . Not on file  Social History Narrative  . Not on file    Vitals:   04/26/17 1542  BP: 118/72  Pulse: 80  Resp: 12  Temp: 98.8 F (37.1 C)  SpO2: 99%   Body mass index is 25.34 kg/m.    Physical Exam  Nursing note and vitals reviewed. Constitutional: She is oriented to person, place, and time. She appears well-developed and well-nourished. No distress.  HENT:  Head: Normocephalic and atraumatic.  Mouth/Throat: Oropharynx is clear and moist and mucous membranes are normal.  Eyes: Pupils are equal, round, and reactive to light. Conjunctivae are normal.  Neck: No tracheal deviation present. No thyroid mass and no thyromegaly present.  Cardiovascular: Normal rate and regular rhythm.  No murmur heard. Pulses:      Dorsalis pedis pulses are 2+ on the right side, and 2+ on the left side.  Tortuous varicose vein medial aspect of left calf.   Respiratory: Effort normal and breath sounds normal. No respiratory distress.  GI: Soft. She exhibits no mass. There is no hepatomegaly. There is no tenderness.  Musculoskeletal: She exhibits no edema.       Left hip: She exhibits normal range of motion, normal strength, no tenderness and no bony tenderness.       Left knee: She exhibits normal range of motion. No tenderness found.       Lumbar back: She exhibits no  tenderness and no bony tenderness.  Lymphadenopathy:    She has no cervical adenopathy.  Neurological: She is alert and oriented to person, place, and time. She has normal strength. Gait normal.  Skin: Skin is warm. No rash noted. No erythema.  Psychiatric: Her mood appears anxious. She expresses no suicidal ideation.  Well groomed, good eye contact.      ASSESSMENT AND PLAN:   Ms. Joanna Shaw was seen today for discuss concerns.  Diagnoses and all orders for this visit: Lab Results  Component Value Date   CREATININE 1.01 04/26/2017   BUN 14 04/26/2017   NA 139 04/26/2017   K 3.9 04/26/2017   CL 105 04/26/2017   CO2 24 04/26/2017   Lab Results  Component Value Date   VITAMINB12  208 (L) 04/26/2017    Left leg pain  Possible etiologies discussed: Vein disease,radicular pain, RLS among some. Examination today negative except for varicose veins. Recommend compression stockings. Lower back imaging ordered. Further recommendations will be given according to imaging results.   -     DG Lumbar Spine Complete; Future  Numbness and tingling of left leg  Same differential Dx listed above among some. Instructed about warning signs. Further recommendations will be given according to lab results.   -     DG Lumbar Spine Complete; Future -     Vitamin B12 -     Basic metabolic panel  Generalized anxiety disorder  After discussion of pharmacologic treatment options,she agrees with trying Lexapro. Side effects discussed. Instructed about warning signs. Continue counseling. FMLA will be completed for the time she already missed at work. 2 hours from Monday to Friday and from 04/04/17 to 04/26/17.. F/U in 4-5 weeks.  -     escitalopram (LEXAPRO) 10 MG tablet; Take 1 tablet (10 mg total) by mouth daily.    Return in about 5 weeks (around 05/31/2017) for anxiety.     -[name] was advised to seek immediate medical attention if sudden worsening  symptoms.      Aubree Doody G. [place]Jordan, MD  [facility]. [place] office.

## 2017-04-27 LAB — BASIC METABOLIC PANEL
BUN: 14 mg/dL (ref 6–23)
CALCIUM: 9.1 mg/dL (ref 8.4–10.5)
CO2: 24 meq/L (ref 19–32)
Chloride: 105 mEq/L (ref 96–112)
Creatinine, Ser: 1.01 mg/dL (ref 0.40–1.20)
GFR: 83.1 mL/min (ref >60.00)
Glucose, Bld: 98 mg/dL (ref 70–99)
POTASSIUM: 3.9 meq/L (ref 3.5–5.1)
SODIUM: 139 meq/L (ref 135–145)

## 2017-04-27 LAB — VITAMIN B12: VITAMIN B 12: 208 pg/mL — AB (ref 211–911)

## 2017-04-30 ENCOUNTER — Encounter: Payer: Self-pay | Admitting: Family Medicine

## 2017-05-01 ENCOUNTER — Ambulatory Visit: Payer: 59 | Admitting: Family Medicine

## 2017-06-29 ENCOUNTER — Encounter: Payer: Self-pay | Admitting: Family Medicine

## 2017-07-04 ENCOUNTER — Ambulatory Visit: Payer: 59

## 2017-07-16 ENCOUNTER — Encounter: Payer: Self-pay | Admitting: Family Medicine

## 2017-07-20 ENCOUNTER — Ambulatory Visit (INDEPENDENT_AMBULATORY_CARE_PROVIDER_SITE_OTHER): Payer: 59 | Admitting: Family Medicine

## 2017-07-20 DIAGNOSIS — Z309 Encounter for contraceptive management, unspecified: Secondary | ICD-10-CM | POA: Diagnosis not present

## 2017-07-20 LAB — POCT URINE PREGNANCY: Preg Test, Ur: NEGATIVE

## 2017-07-20 MED ORDER — MEDROXYPROGESTERONE ACETATE 150 MG/ML IM SUSP
150.0000 mg | Freq: Once | INTRAMUSCULAR | Status: AC
  Administered 2017-07-20: 150 mg via INTRAMUSCULAR

## 2017-07-20 NOTE — Progress Notes (Signed)
Per orders of Dr. Salomon FickBanks, injection of Depo Provera 150 mg given by Aniceto BossNIMMONS, Dewaine Morocho ANN. Patient tolerated injection well.  Dr. Swazil[place]Jordan out of the office today. Patient was past due for injection, ordered a urine pregnancy test first and results were negative.

## 2017-08-14 ENCOUNTER — Encounter: Payer: Self-pay | Admitting: Family Medicine

## 2017-08-16 ENCOUNTER — Encounter: Payer: Self-pay | Admitting: Family Medicine

## 2017-08-16 ENCOUNTER — Ambulatory Visit (INDEPENDENT_AMBULATORY_CARE_PROVIDER_SITE_OTHER): Payer: 59 | Admitting: Family Medicine

## 2017-08-16 ENCOUNTER — Other Ambulatory Visit (HOSPITAL_COMMUNITY)
Admission: RE | Admit: 2017-08-16 | Discharge: 2017-08-16 | Disposition: A | Discharge status: Home / Self Care | Payer: 59 | Source: Ambulatory Visit | Attending: Family Medicine | Admitting: Family Medicine

## 2017-08-16 VITALS — BP 118/60 | HR 60 | Temp 98.5°F | Resp 12 | Ht 66.0 in | Wt 154.5 lb

## 2017-08-16 DIAGNOSIS — R51 Headache: Secondary | ICD-10-CM | POA: Diagnosis not present

## 2017-08-16 DIAGNOSIS — N898 Other specified noninflammatory disorders of vagina: Secondary | ICD-10-CM | POA: Insufficient documentation

## 2017-08-16 DIAGNOSIS — R102 Pelvic and perineal pain: Secondary | ICD-10-CM | POA: Insufficient documentation

## 2017-08-16 DIAGNOSIS — R519 Headache, unspecified: Secondary | ICD-10-CM | POA: Insufficient documentation

## 2017-08-16 LAB — POCT URINALYSIS DIPSTICK
Bilirubin, UA: NEGATIVE
Glucose, UA: NEGATIVE
KETONES UA: NEGATIVE
NITRITE UA: NEGATIVE
Odor: NEGATIVE
PH UA: 7 (ref 5.0–8.0)
PROTEIN UA: NEGATIVE
RBC UA: NEGATIVE
SPEC GRAV UA: 1.015 (ref 1.010–1.025)
UROBILINOGEN UA: 0.2 U/dL

## 2017-08-16 LAB — POCT URINE PREGNANCY: Preg Test, Ur: NEGATIVE

## 2017-08-16 MED ORDER — FLUCONAZOLE 150 MG PO TABS
150.0000 mg | ORAL_TABLET | Freq: Once | ORAL | 0 refills | Status: AC
Start: 2017-08-16 — End: 2017-08-16

## 2017-08-16 MED ORDER — AMITRIPTYLINE HCL 25 MG PO TABS: 25.0000 mg | ORAL_TABLET | Freq: Every day | ORAL | 1 refills | Status: DC

## 2017-08-16 MED ORDER — TERCONAZOLE 0.4 % VA CREA
1.0000 | TOPICAL_CREAM | Freq: Every day | VAGINAL | 1 refills | Status: DC
Start: 2017-08-16 — End: 2017-08-25

## 2017-08-16 NOTE — Progress Notes (Signed)
ACUTE VISIT   HPI:  Chief Complaint  Patient presents with  . Headache    having migraines more often  . Vaginal irritation    sx started Saturday  . Vaginal odor    Joanna Shaw is a 32 y.o. female, who is here today complaining of 4-5 days of vaginal pruritus, intermittent. No vaginal bleeding. Whitish vaginal discharge and dyspareunia.  She thought it was related to bar soap,so changed to body wash. No Hx of STD's.  She has not used OTC medication.   LLQ abdominal cramp, 5/10, radiated to left lower back, menstrual like. LMP last months, she is on Depo Provera. No nausea or vomiting.  Sexually active,no condoms use. She has had same sex partner for a while.  She denies fever,chills,skin rash, or arthralgias.   Headache:  2-3 days of frontal and occipital headache. She has had intermittent headaches for about a year, becoming more frequent and lasting longer. + Cervical pain.  No associated nausea or vomiting,sometimes photophobia. No rhinorrhea or nasal congestion. 1-2 episodes per week.  She has not identified exacerbating factors. Massage and resting in a dark room helps. She is taking OTC Excedrin migraines.  Sometimes she wakes up with headache.    Review of Systems  Constitutional: Negative for activity change, appetite change, fatigue and fever.  HENT: Negative for congestion, mouth sores, nosebleeds, rhinorrhea and trouble swallowing.   Eyes: Positive for photophobia. Negative for pain, discharge and visual disturbance.  Respiratory: Negative for shortness of breath and wheezing.   Cardiovascular: Negative for chest pain, palpitations and leg swelling.  Gastrointestinal: Positive for abdominal pain. Negative for nausea and vomiting.       Negative for changes in bowel habits.  Genitourinary: Positive for dyspareunia. Negative for decreased urine volume, dysuria and hematuria.  Musculoskeletal: Positive for neck pain.  Negative for gait problem.  Skin: Negative for pallor and rash.  Allergic/Immunologic: Negative for environmental allergies.  Neurological: Positive for headaches. Negative for syncope and weakness.      Current Outpatient Medications on File Prior to Visit  Medication Sig Dispense Refill  . escitalopram (LEXAPRO) 10 MG tablet Take 1 tablet (10 mg total) by mouth daily. 30 tablet 1  . medroxyPROGESTERone (DEPO-PROVERA) 150 MG/ML injection Inject 1 mL (150 mg total) into the muscle every 3 (three) months. 1 mL 4   No current facility-administered medications on file prior to visit.      Past Medical History:  Diagnosis Date  . Abnormal Pap smear 2008  . Chlamydia contact, treated 01/11/2011   Treated/ neg TOC  . Chlamydia contact, treated 01/2011   Treated/ neg TOC  . Chlamydia infection 2006  . Late prenatal care complicating pregnancy   . No pertinent past medical history   . Temporary low platelet count (HCC)    at 28 weeks   No Known Allergies  Social History   Socioeconomic History  . Marital status: Single    Spouse name: Not on file  . Number of children: Not on file  . Years of education: Not on file  . Highest education level: Not on file  Occupational History  . Not on file  Social Needs  . Financial resource strain: Not on file  . Food insecurity:    Worry: Not on file    Inability: Not on file  . Transportation needs:    Medical: Not on file    Non-medical: Not on file  Tobacco Use  . Smoking  status: Never Smoker  . Smokeless tobacco: Never Used  Substance and Sexual Activity  . Alcohol use: No  . Drug use: No  . Sexual activity: Yes    Partners: Male    Birth control/protection: None  Lifestyle  . Physical activity:    Days per week: Not on file    Minutes per session: Not on file  . Stress: Not on file  Relationships  . Social connections:    Talks on phone: Not on file    Gets together: Not on file    Attends religious service: Not on  file    Active member of club or organization: Not on file    Attends meetings of clubs or organizations: Not on file    Relationship status: Not on file  Other Topics Concern  . Not on file  Social History Narrative  . Not on file    Vitals:   08/16/17 0934  BP: 118/60  Pulse: 60  Resp: 12  Temp: 98.5 F (36.9 C)  SpO2: 96%   Body mass index is 24.94 kg/m.    Physical Exam  Nursing note and vitals reviewed. Constitutional: She is oriented to person, place, and time. She appears well-developed and well-nourished. No distress.  HENT:  Head: Normocephalic and atraumatic.  Eyes: Pupils are equal, round, and reactive to light. Conjunctivae and EOM are normal.  Cardiovascular: Normal rate and regular rhythm.  No murmur heard. Respiratory: Effort normal and breath sounds normal. No respiratory distress.  GI: Soft. She exhibits no mass. There is no tenderness. There is no CVA tenderness.  Musculoskeletal: She exhibits no edema.       Lumbar back: She exhibits no tenderness and no bony tenderness.  Lymphadenopathy:    She has no cervical adenopathy.  Neurological: She is alert and oriented to person, place, and time. She has normal strength. No cranial nerve deficit. Gait normal.  Reflex Scores:      Patellar reflexes are 2+ on the right side and 2+ on the left side. Skin: Skin is warm. No rash noted. No erythema.  Psychiatric: She has a normal mood and affect.  Well groomed,goog eye contact.    ASSESSMENT AND PLAN:   Joanna Shaw was seen today for headache, vaginal irritation and vaginal odor.  Diagnoses and all orders for this visit:  Vaginal discharge  We discussed possible etiologies. Denies risk factors for STD. ? Fungal, will treat empirically. STD prevention discussed. Further recommendations will be given according to lab results.  -     POC Urinalysis Dipstick -     Urine cytology ancillary only -     terconazole (TERAZOL 7) 0.4 % vaginal cream; Place  1 applicator vaginally at bedtime. -     fluconazole (DIFLUCAN) 150 MG tablet; Take 1 tablet (150 mg total) by mouth once for 1 dose.  Vaginal irritation -     POC Urinalysis Dipstick  Pelvic cramping  Pregnancy test negative. Urine dipstick 3+ LEUK,rest negative,most likely contamination. No abdominal tenderness today on examination.  -     Urine cytology ancillary only -     POCT urine pregnancy  Headache, unspecified headache type  Neurologic exam normal. ? Tension headache vs migraine. After discussion of treatment options as well as some side effects.She agrees with trying Amitriptyline,will start 12.5 mg and increase to 25 mg in 5-7 days. She will monitor for worsening symptoms. I do not think imaging is needed. F/U in 4 weeks.  -  amitriptyline (ELAVIL) 25 MG tablet; Take 1 tablet (25 mg total) by mouth at bedtime.  Other orders -     metroNIDAZOLE (FLAGYL) 500 MG tablet; Take 1 tablet (500 mg total) by mouth 2 (two) times daily for 7 days. -     Urine cytology ancillary only     Return in about 1 month (around 09/13/2017) for HA.     Betty G. [place], MD  [facility]. [place] office.

## 2017-08-16 NOTE — Patient Instructions (Addendum)
A few things to remember from today's visit:   Vaginal discharge - Plan: POC Urinalysis Dipstick, Urine cytology ancillary only, terconazole (TERAZOL 7) 0.4 % vaginal cream, fluconazole (DIFLUCAN) 150 MG tablet  Vaginal irritation - Plan: POC Urinalysis Dipstick  Pelvic cramping - Plan: Urine cytology ancillary only, POCT urine pregnancy  Headache, unspecified headache type - Plan: amitriptyline (ELAVIL) 25 MG tablet  Amitriptyline to start half tablet daily and increase to 1 tablet in 5 to 7 days.    Please be sure medication list is accurate. If a new problem present, please set up appointment sooner than planned today.

## 2017-08-17 LAB — URINE CYTOLOGY ANCILLARY ONLY
Chlamydia: NEGATIVE
Neisseria Gonorrhea: NEGATIVE
Trichomonas: POSITIVE — AB

## 2017-08-17 MED ORDER — METRONIDAZOLE 500 MG PO TABS: 500.0000 mg | ORAL_TABLET | Freq: Two times a day (BID) | ORAL | 0 refills | Status: AC

## 2017-08-18 ENCOUNTER — Telehealth: Payer: Self-pay | Admitting: *Deleted

## 2017-08-18 LAB — URINE CYTOLOGY ANCILLARY ONLY
BACTERIAL VAGINITIS: POSITIVE — AB
Candida vaginitis: NEGATIVE

## 2017-08-18 NOTE — Telephone Encounter (Signed)
Prior auth for Terconazole 0.4% vaginal cream 45gm sent to [id number].

## 2017-08-19 ENCOUNTER — Encounter: Payer: Self-pay | Admitting: Family Medicine

## 2017-08-21 NOTE — Telephone Encounter (Signed)
Fax received from [facility] stating the request was denied and this was sent to Dr Elvis CoilJordan's asst.

## 2017-08-21 NOTE — Telephone Encounter (Signed)
Faxed placed on Dr. Elvis CoilJordan's desk for review.

## 2017-08-21 NOTE — Telephone Encounter (Signed)
Please cancel prescription, she does not longer need it.  Thanks, BJ

## 2017-08-25 ENCOUNTER — Other Ambulatory Visit: Payer: Self-pay | Admitting: *Deleted

## 2017-08-30 NOTE — Telephone Encounter (Signed)
Medication has been removed from medication list. 

## 2017-09-21 DIAGNOSIS — Z6824 Body mass index (BMI) 24.0-24.9, adult: Secondary | ICD-10-CM | POA: Diagnosis not present

## 2017-09-21 DIAGNOSIS — N898 Other specified noninflammatory disorders of vagina: Secondary | ICD-10-CM | POA: Diagnosis not present

## 2017-09-21 DIAGNOSIS — Z01419 Encounter for gynecological examination (general) (routine) without abnormal findings: Secondary | ICD-10-CM | POA: Diagnosis not present

## 2017-10-04 ENCOUNTER — Encounter: Payer: Self-pay | Admitting: Family Medicine

## 2017-10-10 ENCOUNTER — Ambulatory Visit: Payer: 59 | Admitting: Family Medicine

## 2017-10-10 DIAGNOSIS — Z0289 Encounter for other administrative examinations: Secondary | ICD-10-CM

## 2017-10-19 DIAGNOSIS — Z3040 Encounter for surveillance of contraceptives, unspecified: Secondary | ICD-10-CM | POA: Diagnosis not present

## 2017-10-26 ENCOUNTER — Encounter: Payer: Self-pay | Admitting: Family Medicine

## 2017-10-30 ENCOUNTER — Encounter: Payer: Self-pay | Admitting: Family Medicine

## 2017-10-30 ENCOUNTER — Ambulatory Visit: Payer: 59 | Admitting: Family Medicine

## 2017-10-30 VITALS — BP 120/62 | HR 65 | Temp 98.8°F | Resp 12 | Ht 66.0 in | Wt 149.6 lb

## 2017-10-30 DIAGNOSIS — R51 Headache: Secondary | ICD-10-CM | POA: Diagnosis not present

## 2017-10-30 DIAGNOSIS — R5383 Other fatigue: Secondary | ICD-10-CM

## 2017-10-30 DIAGNOSIS — M79604 Pain in right leg: Secondary | ICD-10-CM | POA: Insufficient documentation

## 2017-10-30 DIAGNOSIS — F419 Anxiety disorder, unspecified: Secondary | ICD-10-CM | POA: Insufficient documentation

## 2017-10-30 DIAGNOSIS — R519 Headache, unspecified: Secondary | ICD-10-CM

## 2017-10-30 DIAGNOSIS — E538 Deficiency of other specified B group vitamins: Secondary | ICD-10-CM

## 2017-10-30 DIAGNOSIS — M79605 Pain in left leg: Secondary | ICD-10-CM | POA: Insufficient documentation

## 2017-10-30 MED ORDER — CITALOPRAM HYDROBROMIDE 10 MG PO TABS: 10.0000 mg | ORAL_TABLET | Freq: Every day | ORAL | 2 refills | Status: DC

## 2017-10-30 NOTE — Progress Notes (Signed)
ACUTE VISIT   HPI:  Chief Complaint  Patient presents with  . FMLA    Joanna Shaw is a 32 y.o. female, who is here today requesting FMLA to be completed in order for her to be able to leave work if migraine or anxiety as well as to cover for visits with her psychotherapist.  She was last seen here on 08/16/17 for acute visit, at this time she was also c/o frequent headaches. Amitriptyline was recommended, she did not start medication.  States that she has 2-3 "migraines" per week. She thinks it may be related to be in front of the computer at work and stress.  Bitemporal,frontal,and occipital as well as cervical muscle pain.  Sometimes headache is associated with nausea and photophobia. No vomiting or visual aura. She has tried neck massage.  Anxiety and depression: She follows with psychotherapist "at least" once per week. She states that her therapist doe snot want to complete FMLA,so she continue treatments.  I did recommend Lexapro on 04/26/17 and 4-5 weeks follow up.She took Lexapro for 30 days,she thinks it is "too strong" for her,it was causing drowsiness.  She did not follow as recommended.   Today she is c/o fatigue, "always tired." She wakes up a few times during the night. She has had this problem for over a year,addressed in 04/2016.   B12 deficiency,she did not take B12 supplementation as recommended.  Lab Results  Component Value Date   VITAMINB12 208 (L) 04/26/2017    Lab Results  Component Value Date   WBC 5.1 02/17/2017   HGB 14.4 02/17/2017   HCT 42.6 02/17/2017   MCV 92.1 02/17/2017   PLT 173.0 02/17/2017   Lab Results  Component Value Date   TSH 1.50 04/29/2016   She is also complaining of bilateral hip and lower extremity pain. She states that she has had this for "a long time", usually at night when she is in bed, she thinks this is what is keeping her from sleep. Occasionally tingling sensation.  She denies lower  back pain, saddle anesthesia, or urine/bowel incontinence. She has not tried OTC analgesics. Problem seems to be stable.   Review of Systems  Constitutional: Positive for fatigue. Negative for activity change, appetite change and fever.  HENT: Negative for mouth sores, nosebleeds and trouble swallowing.   Eyes: Negative for redness and visual disturbance.  Respiratory: Negative for cough, shortness of breath and wheezing.   Cardiovascular: Negative for chest pain, palpitations and leg swelling.  Gastrointestinal: Negative for abdominal pain, nausea and vomiting.       Negative for changes in bowel habits.  Genitourinary: Negative for decreased urine volume, dysuria and hematuria.  Musculoskeletal: Positive for arthralgias and myalgias.  Neurological: Positive for headaches. Negative for seizures, syncope, weakness and numbness.  Psychiatric/Behavioral: Positive for sleep disturbance. Negative for confusion, hallucinations and suicidal ideas. The patient is nervous/anxious.       Current Outpatient Medications on File Prior to Visit  Medication Sig Dispense Refill  . medroxyPROGESTERone (DEPO-PROVERA) 150 MG/ML injection Inject 1 mL (150 mg total) into the muscle every 3 (three) months. 1 mL 4   No current facility-administered medications on file prior to visit.      Past Medical History:  Diagnosis Date  . Abnormal Pap smear 2008  . Chlamydia contact, treated 01/11/2011   Treated/ neg TOC  . Chlamydia contact, treated 01/2011   Treated/ neg TOC  . Chlamydia infection 2006  . Late prenatal  care complicating pregnancy   . No pertinent past medical history   . Temporary low platelet count (HCC)    at 28 weeks   No Known Allergies  Social History   Socioeconomic History  . Marital status: Single    Spouse name: Not on file  . Number of children: Not on file  . Years of education: Not on file  . Highest education level: Not on file  Occupational History  . Not on file   Social Needs  . Financial resource strain: Not on file  . Food insecurity:    Worry: Not on file    Inability: Not on file  . Transportation needs:    Medical: Not on file    Non-medical: Not on file  Tobacco Use  . Smoking status: Never Smoker  . Smokeless tobacco: Never Used  Substance and Sexual Activity  . Alcohol use: No  . Drug use: No  . Sexual activity: Yes    Partners: Male    Birth control/protection: None  Lifestyle  . Physical activity:    Days per week: Not on file    Minutes per session: Not on file  . Stress: Not on file  Relationships  . Social connections:    Talks on phone: Not on file    Gets together: Not on file    Attends religious service: Not on file    Active member of club or organization: Not on file    Attends meetings of clubs or organizations: Not on file    Relationship status: Not on file  Other Topics Concern  . Not on file  Social History Narrative  . Not on file    Vitals:   10/30/17 1628  BP: 120/62  Pulse: 65  Resp: 12  Temp: 98.8 F (37.1 C)  SpO2: 97%   Body mass index is 24.15 kg/m.   Physical Exam  Nursing note and vitals reviewed. Constitutional: She is oriented to person, place, and time. She appears well-developed and well-nourished. No distress.  HENT:  Head: Normocephalic and atraumatic.  Mouth/Throat: Oropharynx is clear and moist and mucous membranes are normal.  Eyes: Pupils are equal, round, and reactive to light. Conjunctivae are normal.  Cardiovascular: Normal rate and regular rhythm.  No murmur heard. Pulses:      Dorsalis pedis pulses are 2+ on the right side, and 2+ on the left side.  Respiratory: Effort normal and breath sounds normal. No respiratory distress.  GI: Soft. She exhibits no mass. There is no hepatomegaly. There is no tenderness.  Musculoskeletal: She exhibits no edema.       Right lower leg: She exhibits no tenderness.       Left lower leg: She exhibits no tenderness.  No limitation  of LE joints ROM. No signs of synovitis.  Lymphadenopathy:    She has no cervical adenopathy.  Neurological: She is alert and oriented to person, place, and time. She has normal strength. No cranial nerve deficit. Gait normal.  Reflex Scores:      Patellar reflexes are 2+ on the right side and 2+ on the left side. Skin: Skin is warm. No rash noted. No erythema.  Psychiatric: Her mood appears anxious.  Well groomed, good eye contact.     ASSESSMENT AND PLAN:   Joanna Shaw was seen today for fmla.  Diagnoses and all orders for this visit:  Headache, unspecified headache type  Tension headache vs migraine. She did not try Amitriptyline, which could help with  both. Other treatment options discussed,including new medications. Because frequency referral to neuro was placed. Brain MRI will be arranged. Instructed about warning signs.  -     Ambulatory referral to Neurology  Other fatigue  Chronic. We discussed possible etiologies,including some of her medical problems. Further recommendations will be given according to lab results.  -     Basic metabolic panel; Future -     TSH; Future  Anxiety disorder, unspecified type  She will continue following with psychotherapist. A list of psychiatrists In the area given, recommend establishing with one. We discussed other treatment options,she agrees with trying Celexa. F/U in 3-4 months,ideally with psychiatrist. Instructed about warning signs.  -     citalopram (CELEXA) 10 MG tablet; Take 1 tablet (10 mg total) by mouth daily.  Leg pain, bilateral  Chronic. ? Vein disease. ? Radicular. It seems to be stable. We will hold on lumbar MRI for now, we can wait until she sees neurologist.  B12 deficiency  This problem can contribute to occasional LE tingling and to fatigue. We will check B12 again and give recommendations accordingly.  -     Vitamin B12; Future  Chronic nonintractable headache, unspecified headache  type - MR Brain Wo Contrast; Future    Return in about 1 year (around 10/31/2018) for Follow with psychiatrist.       Betty G. [place], MD  [facility]. [place] office.

## 2017-10-30 NOTE — Patient Instructions (Addendum)
A few things to remember from today's visit:   Headache, unspecified headache type - Plan: Ambulatory referral to Neurology  Other fatigue  Anxiety disorder, unspecified type  Leg pain, bilateral   This time will complete your FMLA but in the future if you need it, recommend your psychotherapist to complete it. If in the future FMLA is needed for headaches, I recommend following neurologist recommendations.   PSYCHIATRICS OFFICES AND PSYCHIATRISTS IN THE AREA  Referral is not necessary. This is a list of options [place] that you can call and arrange an appointment.  -[facility] [phone] -[facility] [phone] [facility]- [facility] [phone] -[facility] [phone]  Dr Annabell SabalMichael F lefaive [phone] Dr Marguerita BeardsKenneth J. Headen, MD [phone] Dr Milagros Evenerupinder [facility], MD [phone]   Dr Jacqulynn Cadetarey G. Bernardo Heaterottle Jr, MD [phone] Dr Madie RenoIrving A. Lugo  Dr Andee PolesMcKinney, [place], MD [phone]  Dr Mar DaringWangelin, Robert, MD [phone]   Please be sure medication list is accurate. If a new problem present, please set up appointment sooner than planned today.

## 2017-10-31 ENCOUNTER — Encounter: Payer: Self-pay | Admitting: Neurology

## 2017-12-25 NOTE — Progress Notes (Deleted)
NEUROLOGY CONSULTATION NOTE  Joanna Shaw MRN: [id number] DOB: 09-Jul-1985  Referring provider: Betty SwazilandJordan, MD Primary care provider: Betty SwazilandJordan, MD  Reason for consult:  headache  HISTORY OF PRESENT ILLNESS: Joanna Shaw is a 32 year old ***-handed female with anxiety and depression who presents for headaches.  History supplemented by referring providers note.  Onset:  *** Location:  *** Quality:  *** Intensity:  ***.  *** denies new headache, thunderclap headache or severe headache that wakes *** from sleep. Aura:  *** Prodrome:  *** Postdrome:  *** Associated symptoms:  ***.  She denies associated unilateral numbness or weakness. Duration:  *** Frequency:  *** Frequency of abortive medication: *** Triggers:  *** Exacerbating factors:  *** Relieving factors:  *** Activity:  ***  Current NSAIDS:  *** Current analgesics:  *** Current triptans:  *** Current ergotamine:  *** Current anti-emetic:  *** Current muscle relaxants:  *** Current anti-anxiolytic:  *** Current sleep aide:  *** Current Antihypertensive medications:  *** Current Antidepressant medications:  Citalopram 10mg  Current Anticonvulsant medications:  *** Current anti-CGRP:  *** Current Vitamins/Herbal/Supplements:  B12 Current Antihistamines/Decongestants:  *** Other therapy:  *** Hormone/birth control: Depo-Provera  Past NSAIDS:  *** Past analgesics:  *** Past abortive triptans:  *** Past abortive ergotamine:  *** Past muscle relaxants:  *** Past anti-emetic:  *** Past antihypertensive medications:  *** Past antidepressant medications:  *** Past anticonvulsant medications:  *** Past anti-CGRP:  *** Past vitamins/Herbal/Supplements:  *** Past antihistamines/decongestants:  *** Other past therapies:  ***  Caffeine:  *** Alcohol:  *** Smoker:  *** Diet:  *** Exercise:  *** Depression:  ***; Anxiety:  *** Other pain:  *** Sleep hygiene:  *** Family history  of headache:  ***  04/26/17 LABS:  BMP normal  PAST MEDICAL HISTORY: Past Medical History:  Diagnosis Date  . Abnormal Pap smear 2008  . Chlamydia contact, treated 01/11/2011   Treated/ neg TOC  . Chlamydia contact, treated 01/2011   Treated/ neg TOC  . Chlamydia infection 2006  . Late prenatal care complicating pregnancy   . No pertinent past medical history   . Temporary low platelet count (HCC)    at 28 weeks    PAST SURGICAL HISTORY: Past Surgical History:  Procedure Laterality Date  . CESAREAN SECTION  05/29/2011   Procedure: CESAREAN SECTION;  Surgeon: Purcell NailsAngela Y Roberts, MD;  Location: [facility];  Service: Gynecology;  Laterality: N/A;  Primar cesarean section of baby  girl at 2043 APGAR  . NO PAST SURGERIES      MEDICATIONS: Current Outpatient Medications on File Prior to Visit  Medication Sig Dispense Refill  . citalopram (CELEXA) 10 MG tablet Take 1 tablet (10 mg total) by mouth daily. 30 tablet 2  . medroxyPROGESTERone (DEPO-PROVERA) 150 MG/ML injection Inject 1 mL (150 mg total) into the muscle every 3 (three) months. 1 mL 4   No current facility-administered medications on file prior to visit.     ALLERGIES: No Known Allergies  FAMILY HISTORY: Family History  Problem Relation Age of Onset  . Hypertension Mother   . Alcohol abuse Mother   . Hypertension Maternal Grandmother   . Anesthesia problems Neg Hx   . Hypotension Neg Hx   . Malignant hyperthermia Neg Hx   . Pseudochol deficiency Neg Hx    SOCIAL HISTORY: Social History   Socioeconomic History  . Marital status: Single    Spouse name: Not on file  . Number of children: Not on  file  . Years of education: Not on file  . Highest education level: Not on file  Occupational History  . Not on file  Social Needs  . Financial resource strain: Not on file  . Food insecurity:    Worry: Not on file    Inability: Not on file  . Transportation needs:    Medical: Not on file    Non-medical: Not on file    Tobacco Use  . Smoking status: Never Smoker  . Smokeless tobacco: Never Used  Substance and Sexual Activity  . Alcohol use: No  . Drug use: No  . Sexual activity: Yes    Partners: Male    Birth control/protection: None  Lifestyle  . Physical activity:    Days per week: Not on file    Minutes per session: Not on file  . Stress: Not on file  Relationships  . Social connections:    Talks on phone: Not on file    Gets together: Not on file    Attends religious service: Not on file    Active member of club or organization: Not on file    Attends meetings of clubs or organizations: Not on file    Relationship status: Not on file  . Intimate partner violence:    Fear of current or ex partner: Not on file    Emotionally abused: Not on file    Physically abused: Not on file    Forced sexual activity: Not on file  Other Topics Concern  . Not on file  Social History Narrative  . Not on file    REVIEW OF SYSTEMS: Constitutional: No fevers, chills, or sweats, no generalized fatigue, change in appetite Eyes: No visual changes, double vision, eye pain Ear, nose and throat: No hearing loss, ear pain, nasal congestion, sore throat Cardiovascular: No chest pain, palpitations Respiratory:  No shortness of breath at rest or with exertion, wheezes GastrointestinaI: No nausea, vomiting, diarrhea, abdominal pain, fecal incontinence Genitourinary:  No dysuria, urinary retention or frequency Musculoskeletal:  No neck pain, back pain Integumentary: No rash, pruritus, skin lesions Neurological: as above Psychiatric: No depression, insomnia, anxiety Endocrine: No palpitations, fatigue, diaphoresis, mood swings, change in appetite, change in weight, increased thirst Hematologic/Lymphatic:  No purpura, petechiae. Allergic/Immunologic: no itchy/runny eyes, nasal congestion, recent allergic reactions, rashes  PHYSICAL EXAM: *** General: No acute distress.  Patient appears ***-groomed.   *** Head:  Normocephalic/atraumatic Eyes:  fundi examined but not visualized Neck: supple, no paraspinal tenderness, full range of motion Back: No paraspinal tenderness Heart: regular rate and rhythm Lungs: Clear to auscultation bilaterally. Vascular: No carotid bruits. Neurological Exam: Mental status: alert and oriented to person, place, and time, recent and remote memory intact, fund of knowledge intact, attention and concentration intact, speech fluent and not dysarthric, language intact. Cranial nerves: CN I: not tested CN II: pupils equal, round and reactive to light, visual fields intact CN III, IV, VI:  full range of motion, no nystagmus, no ptosis CN V: facial sensation intact CN VII: upper and lower face symmetric CN VIII: hearing intact CN IX, X: gag intact, uvula midline CN XI: sternocleidomastoid and trapezius muscles intact CN XII: tongue midline Bulk & Tone: normal, no fasciculations. Motor:  5/5 throughout *** Sensation:  Pinprick *** temperature *** and vibration sensation intact.  ***. Deep Tendon Reflexes:  2+ throughout, *** toes downgoing.  *** Finger to nose testing:  Without dysmetria.  *** Heel to shin:  Without dysmetria.  *** Gait:  Normal station and stride.  Able to turn and tandem walk. Romberg ***.  IMPRESSION: ***  PLAN: ***  Thank you for allowing me to take part in the care of this patient.  Shon Millet, DO  CC: ***

## 2017-12-26 ENCOUNTER — Ambulatory Visit: Payer: 59 | Admitting: Neurology

## 2017-12-26 ENCOUNTER — Encounter: Payer: Self-pay | Admitting: Neurology

## 2017-12-26 DIAGNOSIS — Z029 Encounter for administrative examinations, unspecified: Secondary | ICD-10-CM

## 2018-01-16 DIAGNOSIS — Z3009 Encounter for other general counseling and advice on contraception: Secondary | ICD-10-CM | POA: Diagnosis not present

## 2018-01-19 ENCOUNTER — Ambulatory Visit: Payer: 59 | Admitting: Family Medicine

## 2018-02-21 ENCOUNTER — Ambulatory Visit: Payer: 59 | Admitting: Family Medicine

## 2018-02-21 ENCOUNTER — Encounter: Payer: Self-pay | Admitting: Family Medicine

## 2018-02-21 ENCOUNTER — Encounter: Payer: Self-pay | Admitting: *Deleted

## 2018-02-21 VITALS — BP 120/78 | HR 63 | Temp 98.9°F | Resp 12 | Ht 66.0 in | Wt 150.4 lb

## 2018-02-21 DIAGNOSIS — F419 Anxiety disorder, unspecified: Secondary | ICD-10-CM

## 2018-02-21 NOTE — Progress Notes (Signed)
ACUTE VISIT   HPI:  Chief Complaint  Patient presents with  . Need note for work to work from home    [name] is a 33 y.o. female, who is here today complaining of worsening anxiety, which she thinks is related to her work environment. She has been on Celexa 10 mg since 10/2017, which she thinks is helping. She denies depressed mood or suicidal thoughts.  Today she requesting a letter for her employer to allow her to work from home, which is an option she has available.  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  According to patient, everybody at work is allowed to express their frustration.  So frequently there are some coworkers and screaming, slamming doors, coursing, and hitting desks among some.  These work environment has contributed to worsening anxiety. Yesterday she had an episode of panic attack, she felt like she was going to pass out.  Develop chest tightness, nausea, abdominal cramps, and diarrhea. She is reporting wt loss because her appetite has decreased.  Anxiety episode lasted 30 to 45 minutes.  According to patient, her supervisor recommended trying to work from home.   Review of Systems  Constitutional: Positive for appetite change, fatigue and unexpected weight change. Negative for fever.  Respiratory: Positive for chest tightness.   Gastrointestinal: Positive for abdominal pain, diarrhea and nausea. Negative for vomiting.  Neurological: Positive for dizziness. Negative for syncope, weakness and numbness.  Psychiatric/Behavioral: Negative for confusion and suicidal ideas. The patient is nervous/anxious.     Current Outpatient Medications on File Prior to Visit  Medication Sig Dispense Refill  . citalopram (CELEXA) 10 MG tablet Take 1 tablet (10 mg total) by mouth daily. 30 tablet 2  . medroxyPROGESTERone (DEPO-PROVERA) 150 MG/ML injection Inject 1 mL (150 mg total) into the muscle  every 3 (three) months. 1 mL 4   No current facility-administered medications on file prior to visit.      Past Medical History:  Diagnosis Date  . Abnormal Pap smear 2008  . Chlamydia contact, treated 01/11/2011   Treated/ neg TOC  . Chlamydia contact, treated 01/2011   Treated/ neg TOC  . Chlamydia infection 2006  . Late prenatal care complicating pregnancy   . No pertinent past medical history   . Temporary low platelet count (HCC)    at 28 weeks   Allergies  Allergen Reactions  . Other     Social History   Socioeconomic History  . Marital status: Single    Spouse name: Not on file  . Number of children: Not on file  . Years of education: Not on file  . Highest education level: Not on file  Occupational History  . Not on file  Social Needs  . Financial resource strain: Not on file  . Food insecurity:    Worry: Not on file    Inability: Not on file  . Transportation needs:    Medical: Not on file    Non-medical: Not on file  Tobacco Use  . Smoking status: Never Smoker  . Smokeless tobacco: Never Used  Substance and Sexual Activity  . Alcohol use: No  . Drug use: No  . Sexual activity: Yes    Partners: Male    Birth control/protection: None  Lifestyle  . Physical activity:    Days per week: Not on file    Minutes per session: Not on file  . Stress:  Not on file  Relationships  . Social connections:    Talks on phone: Not on file    Gets together: Not on file    Attends religious service: Not on file    Active member of club or organization: Not on file    Attends meetings of clubs or organizations: Not on file    Relationship status: Not on file  Other Topics Concern  . Not on file  Social History Narrative  . Not on file    Vitals:   02/21/18 0809  BP: 120/78  Pulse: 63  Resp: 12  Temp: 98.9 F (37.2 C)  SpO2: 99%   Body mass index is 24.27 kg/m. Wt Readings from Last 3 Encounters:  02/21/18 150 lb 6 oz (68.2 kg)  10/30/17 149 lb 9.6  oz (67.9 kg)  08/16/17 154 lb 8 oz (70.1 kg)     Physical Exam  Nursing note and vitals reviewed. Constitutional: She is oriented to person, place, and time. She appears well-developed and well-nourished. No distress.  HENT:  Head: Normocephalic.  Mouth/Throat: Oropharynx is clear and moist. Mucous membranes are dry.  Eyes: Pupils are equal, round, and reactive to light. Conjunctivae are normal.  Cardiovascular: Normal rate and regular rhythm.  No murmur heard. Respiratory: Effort normal and breath sounds normal. No respiratory distress.  GI: Soft. She exhibits no mass. There is no abdominal tenderness.  Musculoskeletal:        General: No edema.  Neurological: She is alert and oriented to person, place, and time. She has normal strength. Gait normal.  Skin: Skin is warm. No erythema.  Psychiatric: Her mood appears anxious. Cognition and memory are normal. She expresses no suicidal ideation. She expresses no suicidal plans.  Well-groomed, good eye contact.    ASSESSMENT AND PLAN:  Ms. Joanna Shaw was seen today for need note for work to work from home.  Diagnoses and all orders for this visit:  Anxiety disorder, unspecified type  Recommend continue following with psychotherapist. For now she does not want to change Celexa dose, she thinks working from home will help with anxiety.  She will let me know if she decides to increase Celexa dose from 10 mg to 20 mg. Letter for work given. Instructed about warning signs. Follow-up in 3 to 4 months.    Return in about 4 months (around 06/22/2018) for anxiety.    Joanna Demonbreun G. Swaziland, MD  [facility]. [place] [facility].

## 2018-03-12 ENCOUNTER — Telehealth: Payer: Self-pay | Admitting: Family Medicine

## 2018-03-12 NOTE — Telephone Encounter (Signed)
Copied from CRM [phone]. Topic: Quick Communication - See Telephone Encounter >> Mar 12, 2018  9:01 AM Louie BunPalacios Medina, Rosey Batheresa D wrote: CRM for notification. See Telephone encounter for: 03/12/18. Patient called and would like a call back from a nurse regarding her FMLA paperwork send to provider. Please call patient back, thanks.

## 2018-03-13 NOTE — Telephone Encounter (Signed)
Patient informed that FMLA paperwork was faxed with confirmation on 03/08/2018. Patient stated that she would call to make sure they received forms.

## 2018-05-15 ENCOUNTER — Encounter: Payer: Self-pay | Admitting: Family Medicine

## 2018-05-23 ENCOUNTER — Encounter: Payer: Self-pay | Admitting: Family Medicine

## 2018-05-29 ENCOUNTER — Encounter: Payer: Self-pay | Admitting: Family Medicine

## 2018-05-30 ENCOUNTER — Ambulatory Visit (INDEPENDENT_AMBULATORY_CARE_PROVIDER_SITE_OTHER): Payer: 59 | Admitting: Family Medicine

## 2018-05-30 ENCOUNTER — Encounter: Payer: Self-pay | Admitting: Family Medicine

## 2018-05-30 ENCOUNTER — Other Ambulatory Visit: Payer: Self-pay

## 2018-05-30 VITALS — Resp 12

## 2018-05-30 DIAGNOSIS — R51 Headache: Secondary | ICD-10-CM | POA: Diagnosis not present

## 2018-05-30 DIAGNOSIS — F419 Anxiety disorder, unspecified: Secondary | ICD-10-CM

## 2018-05-30 DIAGNOSIS — R519 Headache, unspecified: Secondary | ICD-10-CM

## 2018-05-30 NOTE — Assessment & Plan Note (Signed)
She does not want to adjust treatment, states the Celexa 10 mg is helping along with working from home. She will continue following with her psychotherapist weekly. Instructed to request FMLA from her therapist, given the fact form needs to be completed in order for her to keep appointments.

## 2018-05-30 NOTE — Assessment & Plan Note (Addendum)
Migraine vs tension like headache. Problem has been stable. We discussed other pharmacologic treatment. Because she is not on birth control, I do not feel comfortable with trazodone. She would like to try to schedule appointment with neurologist, referral was placed in 10/2017, so I do not think she needs a new one but if she does she was instructed to let us know.

## 2018-05-30 NOTE — Progress Notes (Signed)
Virtual Visit via Video Note   I connected with Joanna Shaw on 05/30/18 at  2:00 PM EDT by a video enabled telemedicine application and verified that I am speaking with the correct person using two identifiers.  Location patient: home Location provider: home office Persons participating in the virtual visit: patient, provider  I discussed the limitations of evaluation and management by telemedicine and the availability of in person appointments. She expressed understanding and agreed to proceed.   HPI: Joanna Joanna Shaw is a 33 yo female with Hx of anxiety and migraine,who is being seen today for chronic disease management. She was last seen on 02/21/18,when she was c/o worsening anxiety,exacerbated by work environment.She requested FMLA ,so she could work from home.  In 10/30/17 it was recommended to establish with psychiatrist because at that time she was also reporting worsening symptoms. At that time I did complete a FMLA to cover her visits with psychotherapist and until establishing with psychiatrist. She is on Celexa 10 mg daily.  According to pt, her counselor can not complete FMLA that covers her visits. She [facility]otherapist at [facility].Last appt was this past Saturday and next appt 06/11/18.  She states that symptoms have greatly improved since she has been working from home but FMLA needs to be filled out a few times per year, even though I recommended changes from 03/2018 to 01/2019.  Recently she also requested a FMLA for intermittent leave and reduced schedule that covers for headache and anxiety. In 10/2018 she was referred to neurologist because persistent headache and brain MRI was ordered. She did not keep neuro or MRI appt.  Amitriptyline helped but caused drowsiness next day.   Frontal and occipital headache. Sometimes associated photophobia,negative for nausea and vomiting. + Cervicalgia. Headache q 2 weeks,it last 1-2 days. She feels better when she is  in a dark room.  LMP 3-4 weeks ago. She is not on birth control.  ROS: See pertinent positives and negatives per HPI. COVID-19 screening questions: Denies new fever,cough,sore throat,or possible exposure to COVID-19. Negative for loss in the sense of smell or taste.   Past Medical History:  Diagnosis Date  . Abnormal Pap smear 2008  . Chlamydia contact, treated 01/11/2011   Treated/ neg TOC  . Chlamydia contact, treated 01/2011   Treated/ neg TOC  . Chlamydia infection 2006  . Late prenatal care complicating pregnancy   . No pertinent past medical history   . Temporary low platelet count (HCC)    at 28 weeks    Past Surgical History:  Procedure Laterality Date  . CESAREAN SECTION  05/29/2011   Procedure: CESAREAN SECTION;  Surgeon: Purcell NailsAngela Y Roberts, MD;  Location: [facility];  Service: Gynecology;  Laterality: N/A;  Primar cesarean section of baby  girl at 2043 APGAR  . NO PAST SURGERIES      Family History  Problem Relation Age of Onset  . Hypertension Mother   . Alcohol abuse Mother   . Hypertension Maternal Grandmother   . Anesthesia problems Neg Hx   . Hypotension Neg Hx   . Malignant hyperthermia Neg Hx   . Pseudochol deficiency Neg Hx     Social History   Socioeconomic History  . Marital status: Single    Spouse name: Not on file  . Number of children: Not on file  . Years of education: Not on file  . Highest education level: Not on file  Occupational History  . Not on file  Social Needs  .  Financial resource strain: Not on file  . Food insecurity:    Worry: Not on file    Inability: Not on file  . Transportation needs:    Medical: Not on file    Non-medical: Not on file  Tobacco Use  . Smoking status: Never Smoker  . Smokeless tobacco: Never Used  Substance and Sexual Activity  . Alcohol use: No  . Drug use: No  . Sexual activity: Yes    Partners: Male    Birth control/protection: None  Lifestyle  . Physical activity:    Days per week: Not on  file    Minutes per session: Not on file  . Stress: Not on file  Relationships  . Social connections:    Talks on phone: Not on file    Gets together: Not on file    Attends religious service: Not on file    Active member of club or organization: Not on file    Attends meetings of clubs or organizations: Not on file    Relationship status: Not on file  . Intimate partner violence:    Fear of current or ex partner: Not on file    Emotionally abused: Not on file    Physically abused: Not on file    Forced sexual activity: Not on file  Other Topics Concern  . Not on file  Social History Narrative  . Not on file     Current Outpatient Medications:  .  citalopram (CELEXA) 10 MG tablet, Take 1 tablet (10 mg total) by mouth daily., Disp: 30 tablet, Rfl: 2 .  medroxyPROGESTERone (DEPO-PROVERA) 150 MG/ML injection, Inject 1 mL (150 mg total) into the muscle every 3 (three) months., Disp: 1 mL, Rfl: 4  EXAM:  VITALS per patient if applicable:Resp 12   LMP 05/09/2018 (Approximate)   GENERAL: alert, oriented, appears well and in no acute distress  HEENT: atraumatic, normocephalic,conjunctiva clear.  LUNGS: on inspection no signs of respiratory distress, breathing rate appears normal, no obvious gross SOB, gasping or wheezing  PSYCH/NEURO: pleasant and cooperative, no obvious depression,+ anxiety. Speech and thought processing grossly intact  ASSESSMENT AND PLAN:  Discussed the following assessment and plan:  Headache, unspecified headache type Migraine vs tension like headache. Problem has been stable. We discussed other pharmacologic treatment. Because she is not on birth control, I do not feel comfortable with trazodone. She would like to try to schedule appointment with neurologist, referral was placed in 10/2017, so I do not think she needs a new one but if she does she was instructed to let us know.   Anxiety disorder She does not want to adjust treatment, states the  Celexa 10 mg is helping along with working from home. She will continue following with her psychotherapist weekly. Instructed to request FMLA from her therapist, given the fact form needs to be completed in order for her to keep appointments.  She will find out how often FMLA needs to be completed for her to continue working from home.This has helped with anxiety, so recommend continuing working from home if she can perform all her job functions from home.   I discussed the assessment and treatment plan with the patient. She was provided an opportunity to ask questions and all were answered. The patient agreed with the plan and demonstrated an understanding of the instructions.     Return in about 8 months (around 02/08/2019) for anxiety.    Betty Swaziland, MD

## 2018-06-29 ENCOUNTER — Other Ambulatory Visit: Payer: Self-pay

## 2018-06-29 ENCOUNTER — Other Ambulatory Visit (HOSPITAL_COMMUNITY)
Admission: RE | Admit: 2018-06-29 | Discharge: 2018-06-29 | Disposition: A | Discharge status: Home / Self Care | Payer: 59 | Source: Ambulatory Visit | Attending: Family Medicine | Admitting: Family Medicine

## 2018-06-29 ENCOUNTER — Ambulatory Visit (INDEPENDENT_AMBULATORY_CARE_PROVIDER_SITE_OTHER): Payer: 59 | Admitting: Family Medicine

## 2018-06-29 ENCOUNTER — Encounter: Payer: Self-pay | Admitting: Family Medicine

## 2018-06-29 VITALS — BP 120/74 | HR 74 | Temp 98.7°F | Resp 12 | Ht 66.0 in | Wt 157.1 lb

## 2018-06-29 DIAGNOSIS — Z0000 Encounter for general adult medical examination without abnormal findings: Secondary | ICD-10-CM | POA: Diagnosis not present

## 2018-06-29 DIAGNOSIS — Z309 Encounter for contraceptive management, unspecified: Secondary | ICD-10-CM | POA: Diagnosis not present

## 2018-06-29 DIAGNOSIS — Z113 Encounter for screening for infections with a predominantly sexual mode of transmission: Secondary | ICD-10-CM | POA: Insufficient documentation

## 2018-06-29 DIAGNOSIS — Z131 Encounter for screening for diabetes mellitus: Secondary | ICD-10-CM | POA: Diagnosis not present

## 2018-06-29 LAB — POCT GLYCOSYLATED HEMOGLOBIN (HGB A1C): Hemoglobin A1C: 4.8 % (ref 4.0–5.6)

## 2018-06-29 LAB — POCT URINE PREGNANCY: Preg Test, Ur: NEGATIVE

## 2018-06-29 MED ORDER — MEDROXYPROGESTERONE ACETATE 150 MG/ML IM SUSP
150.0000 mg | Freq: Once | INTRAMUSCULAR | Status: AC
  Administered 2018-06-29: 150 mg via INTRAMUSCULAR

## 2018-06-29 NOTE — Patient Instructions (Signed)
A few things to remember from today's visit:   Encounter for contraceptive management, unspecified type - Plan: POCT urine pregnancy  Screen for STD (sexually transmitted disease) - Plan: Urine cytology ancillary only  Diabetes mellitus screening - Plan: POCT glycosylated hemoglobin (Hb A1C)  Screening for endocrine, metabolic and immunity disorder  Today you have you routine preventive visit.  At least 150 minutes of moderate exercise per week, daily brisk walking for 15-30 min is a good exercise option. Healthy diet low in saturated (animal) fats and sweets and consisting of fresh fruits and vegetables, lean meats such as fish and white chicken and whole grains.  These are some of recommendations for screening depending of age and risk factors:   - Vaccines:  Tdap vaccine every 10 years.  Shingles vaccine recommended at age 30, could be given after 31 years of age but not sure about insurance coverage.   Pneumonia vaccines:  Prevnar 13 at 65 and Pneumovax at 66. Sometimes Pneumovax is giving earlier if history of smoking, lung disease,diabetes,kidney disease among some.    Screening for diabetes at age 36 and every 3 years.  Cervical cancer prevention:  Pap smear starts at 31 years of age and continues periodically until 31 years old in low risk women. Pap smear every 3 years between 22 and 47 years old. Pap smear every 3-5 years between women 30 and older if pap smear negative and HPV screening negative.   -Breast cancer: Mammogram: There is disagreement between experts about when to start screening in low risk asymptomatic female but recent recommendations are to start screening at 84 and not later than 31 years old , every 1-2 years and after 31 yo q 2 years. Screening is recommended until 31 years old but some women can continue screening depending of healthy issues.   Colon cancer screening: starts at 31 years old until 31 years old.  Cholesterol disorder screening at  age 35 and every 3 years.  Also recommended:  1. Dental visit- Brush and floss your teeth twice daily; visit your dentist twice a year. 2. Eye doctor- Get an eye exam at least every 2 years. 3. Helmet use- Always wear a helmet when riding a bicycle, motorcycle, rollerblading or skateboarding. 4. Safe sex- If you may be exposed to sexually transmitted infections, use a condom. 5. Seat belts- Seat belts can save your live; always wear one. 6. Smoke/Carbon Monoxide detectors- These detectors need to be installed on the appropriate level of your home. Replace batteries at least once a year. 7. Skin cancer- When out in the sun please cover up and use sunscreen 15 SPF or higher. 8. Violence- If anyone is threatening or hurting you, please tell your healthcare provider.  9. Drink alcohol in moderation- Limit alcohol intake to one drink or less per day. Never drink and drive.   Please be sure medication list is accurate. If a new problem present, please set up appointment sooner than planned today.

## 2018-06-29 NOTE — Progress Notes (Signed)
HPI:  Chief Complaint  Patient presents with  . Annual Exam    [name] is a 33 y.o. female, who is here today for her routine CPE. Last CPE:02/17/17  She lives with her daughter. Regular exercise:Yes Healthful diet:Yes.  Immunization History  Administered Date(s) Administered  . Td 02/13/2005  . Tdap 05/30/2011    Last Pap smear: 02/17/2017- for malignancy.  Positive for chlamydia. History of abnormal Pap smear: LGSIL a few years ago, last 2 Pap smears negative. History of STDs:She has been treated for chlamydia.  She has not been sexually active since 02/2018.   Menarche: 14 LMP: 3-4 months ago, while she was receiving Depo-Provera G:1  Birth control:Depo Provera. She missed last injection. States that she has not been sexually active since 02/2018.  Hx of anxiety and headaches, she is reporting great improvement of both since she has been working from home. FMLA forms have been completed for her to be allowed to work from [employer] that she does not longer need FMLA because everybody is now working from home for the rest of the year due to COVID 19 pandemia.  She follows with psychotherapist regularly and on Celexa 10 mg daily.  Review of Systems  Constitutional: Negative for appetite change, fatigue and fever.  HENT: Negative for dental problem, hearing loss, mouth sores, sore throat, trouble swallowing and voice change.   Eyes: Negative for redness and visual disturbance.  Respiratory: Negative for cough, shortness of breath and wheezing.   Cardiovascular: Negative for chest pain and leg swelling.  Gastrointestinal: Negative for abdominal pain, nausea and vomiting.       No changes in bowel habits.  Endocrine: Negative for cold intolerance, heat intolerance, polydipsia, polyphagia and polyuria.  Genitourinary: Negative for decreased urine volume, dysuria, hematuria, vaginal bleeding and vaginal discharge.  Musculoskeletal: Negative for  arthralgias, gait problem and myalgias.  Skin: Negative for color change and rash.  Allergic/Immunologic: Negative for environmental allergies.  Neurological: Negative for syncope, weakness and worsening headaches.  Hematological: Negative for adenopathy. Does not bruise/bleed easily.  Psychiatric/Behavioral: Negative for confusion and sleep disturbance. The patient is nervous/anxious.   All other systems reviewed and are negative.    Current Outpatient Medications on File Prior to Visit  Medication Sig Dispense Refill  . citalopram (CELEXA) 10 MG tablet Take 1 tablet (10 mg total) by mouth daily. 30 tablet 2  . medroxyPROGESTERone (DEPO-PROVERA) 150 MG/ML injection Inject 1 mL (150 mg total) into the muscle every 3 (three) months. 1 mL 4   No current facility-administered medications on file prior to visit.     Past Medical History:  Diagnosis Date  . Abnormal Pap smear 2008  . Chlamydia contact, treated 01/11/2011   Treated/ neg TOC  . Chlamydia contact, treated 01/2011   Treated/ neg TOC  . Chlamydia infection 2006  . Late prenatal care complicating pregnancy   . No pertinent past medical history   . Temporary low platelet count (HCC)    at 28 weeks    Allergies  Allergen Reactions  . Other     Social History   Socioeconomic History  . Marital status: Single    Spouse name: Not on file  . Number of children: Not on file  . Years of education: Not on file  . Highest education level: Not on file  Occupational History  . Not on file  Social Needs  . Financial resource strain: Not on file  . Food insecurity:  Worry: Not on file    Inability: Not on file  . Transportation needs:    Medical: Not on file    Non-medical: Not on file  Tobacco Use  . Smoking status: Never Smoker  . Smokeless tobacco: Never Used  Substance and Sexual Activity  . Alcohol use: No  . Drug use: No  . Sexual activity: Yes    Partners: Male    Birth control/protection: None   Lifestyle  . Physical activity:    Days per week: Not on file    Minutes per session: Not on file  . Stress: Not on file  Relationships  . Social connections:    Talks on phone: Not on file    Gets together: Not on file    Attends religious service: Not on file    Active member of club or organization: Not on file    Attends meetings of clubs or organizations: Not on file    Relationship status: Not on file  Other Topics Concern  . Not on file  Social History Narrative  . Not on file    Today's Vitals   06/29/18 0902  BP: 120/74  Pulse: 74  Resp: 12  Temp: 98.7 F (37.1 C)  TempSrc: Oral  SpO2: 100%  Weight: 157 lb 2 oz (71.3 kg)  Height: 5\' 6"  (1.676 m)    Body mass index is 25.36 kg/m.   Physical Exam  Nursing note and vitals reviewed. Constitutional: She is oriented to person, place, and time. She appears well-developed and well-nourished. No distress.  HENT:  Head: Normocephalic and atraumatic.  Right Ear: Hearing, tympanic membrane, external ear and ear canal normal.  Left Ear: Hearing, tympanic membrane, external ear and ear canal normal.  Mouth/Throat: Uvula is midline, oropharynx is clear and moist and mucous membranes are normal.  Eyes: Pupils are equal, round, and reactive to light. Conjunctivae and EOM are normal.  Neck: No tracheal deviation present. No thyromegaly present.  Cardiovascular: Normal rate and regular rhythm.  No murmur heard. Pulses:      Dorsalis pedis pulses are 2+ on the right side, and 2+ on the left side.  Respiratory: Effort normal and breath sounds normal. No respiratory distress.  GI: Soft. She exhibits no mass. There is no hepatomegaly. There is no tenderness.  Genitourinary:   Comments: Breast: No masses, skin abnormalities, or nipple discharge appreciated bilateral. Musculoskeletal:        General: No edema.     Comments: No major deformity or signs of synovitis appreciated.  Lymphadenopathy:    She has no cervical or  axillary adenopathy.       Right: No supraclavicular adenopathy present.       Left: No supraclavicular adenopathy present.  Neurological: She is alert and oriented to person, place, and time. She has normal strength. No cranial nerve deficit. Coordination and gait normal.  Reflex Scores:      Bicep reflexes are 2+ on the right side and 2+ on the left side.      Patellar reflexes are 2+ on the right side and 2+ on the left side. Skin: Skin is warm. No rash noted. No erythema.  Psychiatric: She has a normal mood and affect. Well groomed, good eye contact.    ASSESSMENT AND PLAN:  Ms. Joanna Shaw was seen today for annual exam.  Diagnoses and all orders for this visit:  Lab Results  Component Value Date   HGBA1C 4.8 06/29/2018    Routine physical examination We discussed  the importance of regular physical activity and healthy diet for prevention of chronic illness and/or complications. Preventive guidelines reviewed. Due for pap smear in 2022. Vaccination up to date. Next CPE in a year.  Encounter for contraceptive management, unspecified type -     POCT urine pregnancy -     medroxyPROGESTERone (DEPO-PROVERA) injection 150 mg  Screen for STD (sexually transmitted disease) -     Urine cytology ancillary only  Diabetes mellitus screening -     POCT glycosylated hemoglobin (Hb A1C)     Return in 1 year (on 06/29/2019) for CPE.    Chaley Castellanos [place], MD [facility]. [place] office.

## 2018-07-03 LAB — URINE CYTOLOGY ANCILLARY ONLY
Chlamydia: NEGATIVE
Neisseria Gonorrhea: NEGATIVE
Trichomonas: NEGATIVE

## 2018-07-04 ENCOUNTER — Encounter: Payer: Self-pay | Admitting: Family Medicine

## 2018-07-24 ENCOUNTER — Encounter: Payer: Self-pay | Admitting: Family Medicine

## 2018-07-31 ENCOUNTER — Encounter: Payer: Self-pay | Admitting: Family Medicine

## 2018-07-31 ENCOUNTER — Telehealth: Payer: Self-pay | Admitting: *Deleted

## 2018-07-31 ENCOUNTER — Ambulatory Visit (INDEPENDENT_AMBULATORY_CARE_PROVIDER_SITE_OTHER): Payer: 59 | Admitting: Family Medicine

## 2018-07-31 ENCOUNTER — Other Ambulatory Visit: Payer: Self-pay

## 2018-07-31 DIAGNOSIS — G4700 Insomnia, unspecified: Secondary | ICD-10-CM | POA: Diagnosis not present

## 2018-07-31 DIAGNOSIS — Z309 Encounter for contraceptive management, unspecified: Secondary | ICD-10-CM

## 2018-07-31 DIAGNOSIS — F419 Anxiety disorder, unspecified: Secondary | ICD-10-CM | POA: Diagnosis not present

## 2018-07-31 MED ORDER — DOXEPIN HCL 10 MG/ML PO CONC: 3.0000 mg | Freq: Every day | ORAL | 0 refills | Status: DC

## 2018-07-31 NOTE — Progress Notes (Signed)
Virtual Visit via Video Note   I connected with [name] on 08/03/18 at  9:30 AM EDT by a video enabled telemedicine application and verified that I am speaking with the correct person using two identifiers.  Location patient: home Location provider:home office Persons participating in the virtual visit: patient, provider  I discussed the limitations of evaluation and management by telemedicine and the availability of in person appointments. The patient expressed understanding and agreed to proceed.   HPI: [name] is a 33 yo female with Hx of anxiety who is c/o worsening symptoms.  She is requesting an FMLA completed to cover for some days. She has been out from work since 07/17/18 and needs FMLA from 07/25/18 to 08/08/18. Worsening anxiety and having difficult staying asleep,she wakes up q 2 hours. Requesting something to help her sleep while she sees psychiatrist.  States that she knows  "everything is fine" but always worries about her wellbeing.She feels like something is going to happen to her, like a wreck among other thoughts.   She is on Celexa 10 mg daily.  She denies suicidal thoughts.  She has not identified exacerbating or alleviating factors.  States that her psychologist has cancelled appts. She also would like a referral to psychiatrist.  + Fatigue. Negative for chills,fever,body aches,headache,syncope,CP,palpitations, N/V,abdominal pain,or changes in bowel habits.  She is on Depo Provera for birth control, she wonders if it is causing/aggravating anxiety. She has been on Depo Provera for 5+ years.   ROS: See pertinent positives and negatives per HPI.  Past Medical History:  Diagnosis Date  . Abnormal Pap smear 2008  . Chlamydia contact, treated 01/11/2011   Treated/ neg TOC  . Chlamydia contact, treated 01/2011   Treated/ neg TOC  . Chlamydia infection 2006  . Late prenatal care complicating pregnancy   . No pertinent past medical history   . Temporary  low platelet count (HCC)    at 28 weeks    Past Surgical History:  Procedure Laterality Date  . CESAREAN SECTION  05/29/2011   Procedure: CESAREAN SECTION;  Surgeon: Delice Lesch, MD;  Location: [facility];  Service: Gynecology;  Laterality: N/A;  Primar cesarean section of baby  girl at 2043 APGAR  . NO PAST SURGERIES      Family History  Problem Relation Age of Onset  . Hypertension Mother   . Alcohol abuse Mother   . Hypertension Maternal Grandmother   . Anesthesia problems Neg Hx   . Hypotension Neg Hx   . Malignant hyperthermia Neg Hx   . Pseudochol deficiency Neg Hx     Social History   Socioeconomic History  . Marital status: Single    Spouse name: Not on file  . Number of children: Not on file  . Years of education: Not on file  . Highest education level: Not on file  Occupational History  . Not on file  Social Needs  . Financial resource strain: Not on file  . Food insecurity    Worry: Not on file    Inability: Not on file  . Transportation needs    Medical: Not on file    Non-medical: Not on file  Tobacco Use  . Smoking status: Never Smoker  . Smokeless tobacco: Never Used  Substance and Sexual Activity  . Alcohol use: No  . Drug use: No  . Sexual activity: Yes    Partners: Male    Birth control/protection: None  Lifestyle  . Physical activity  Days per week: Not on file    Minutes per session: Not on file  . Stress: Not on file  Relationships  . Social Musicianconnections    Talks on phone: Not on file    Gets together: Not on file    Attends religious service: Not on file    Active member of club or organization: Not on file    Attends meetings of clubs or organizations: Not on file    Relationship status: Not on file  . Intimate partner violence    Fear of current or ex partner: Not on file    Emotionally abused: Not on file    Physically abused: Not on file    Forced sexual activity: Not on file  Other Topics Concern  . Not on file  Social  History Narrative  . Not on file      Current Outpatient Medications:  .  citalopram (CELEXA) 10 MG tablet, Take 1 tablet (10 mg total) by mouth daily., Disp: 30 tablet, Rfl: 2 .  doxepin (SINEQUAN) 10 MG/ML solution, Take 0.3-0.6 mLs (3-6 mg total) by mouth at bedtime., Disp: 30 mL, Rfl: 0 .  medroxyPROGESTERone (DEPO-PROVERA) 150 MG/ML injection, Inject 1 mL (150 mg total) into the muscle every 3 (three) months., Disp: 1 mL, Rfl: 4  EXAM:  VITALS per patient if applicable:N/A  GENERAL: alert, oriented, appears well and in no acute distress  HEENT: atraumatic, conjunctiva clear, no obvious abnormalities on inspection.  NECK: normal movements of the head and neck  LUNGS: on inspection no signs of respiratory distress, breathing rate appears normal, no obvious gross SOB, gasping or wheezing  CV: no obvious cyanosis  MS: moves all visible extremities without noticeable abnormality  PSYCH/NEURO: pleasant and cooperative, no obvious depression,+ anxious. Speech and thought processing grossly intact  ASSESSMENT AND PLAN:  Discussed the following assessment and plan:  Insomnia, unspecified type - Plan: doxepin (SINEQUAN) 10 MG/ML solution. Pharmacologic treatment options discussed. After discussion of some side effects,she agrees with trying Doxepin 3 mg at bedtime,she can titrate dose up to 10 mg if needed. Good sleep hygiene. She will continue following with psychiatrist.  Encounter for contraceptive management, unspecified type - Plan: I do not think Depo Provera is causing or aggravating anxiety,she has been on it for many years. Because she has been on Depo Provera for over 5 years,recommend considering other options. We discussed other birth control options, she prefers not to make changes for now.   Anxiety disorder A list of psychiatrist in the area given,so she can call and arrange appt. No changes in Celexa for now. FMLA from 6/17 to 7/1 will be  completed. Instructed about warning signs.   I discussed the assessment and treatment plan with the patient. She was provided an opportunity to ask questions and all were answered. The patient agreed with the plan and demonstrated an understanding of the instructions.    Return if symptoms worsen or fail to improve, for Continue following with psychiatrist..    Maurizio Geno [place], MD

## 2018-07-31 NOTE — Telephone Encounter (Signed)
   Copied from [place] [id number]. Topic: Quick Communication - See Telephone Encounter >> Jul 26, 2018  8:12 AM L[place] wrote: CRM for notification. See Telephone encounter for: 07/26/18. [employer] calling [place] ) at [phone] states would like a return call from pt nurse to confirm the disorder of pt. The reference is [id number]

## 2018-07-31 NOTE — Patient Instructions (Addendum)
PSYCHIATRIC OFFICES AND PSYCHIATRISTS IN THE AREA  This is a list of options around [place],  that you can call and arrange an appointment.  -[facility] [phone] -[facility] [phone] - [facility] [phone] -[facility] [phone]  Dr Michael F lefaive [phone] Dr Kenneth J. Headen, MD [phone] Dr Rupinder Kaur, MD [phone]   Dr Carey G. Cottle Jr, MD [phone] Dr Irving A. Lugo  Dr McKinney, Parish, MD [phone]  Dr Wangelin, Robert, MD [phone]  Dr Provsky [phone]  

## 2018-07-31 NOTE — Telephone Encounter (Signed)
Spoke with [employer] and informed them that forms were received and placed on PCP's desk for completion and would be faxed as soon as they are completed.

## 2018-07-31 NOTE — Assessment & Plan Note (Signed)
A list of psychiatrist in the area given,so she can call and arrange appt. No changes in Celexa for now. FMLA from 6/17 to 7/1 will be completed. Instructed about warning signs.

## 2018-08-08 ENCOUNTER — Other Ambulatory Visit: Payer: Self-pay | Admitting: Family Medicine

## 2018-08-08 NOTE — Telephone Encounter (Signed)
Original FMLA forms completed on 08/01/2018 attached to release form received on 08/08/2018 placed on provider's desk for completion.

## 2018-08-08 NOTE — Telephone Encounter (Signed)
Pt is calling and would like dr Martinique nurse to return her call concerning disability paperwork. Pt is trying to go back to work tomorrow

## 2018-08-08 NOTE — Telephone Encounter (Signed)
We discussed this during last appt 07/31/18. I let her know that I have not received FMLA forms, I do not member seeing them Monday either. We can provide a letter until forms are completed, from 6/17 to 08/08/18 as we agreed last visit.  Thanks, BJ

## 2018-08-08 NOTE — Telephone Encounter (Signed)
Office received release form from [employer]. Spoke with patient and she stated that [employer] stated that she needed the release form signed and a copy of the note from July 25, 2018. Patient aware that PCP is out of the office until Monday. Patient stated that she would call [employer] and let them know that paperwork will not be completed by tomorrow and she may not be able to return to work until Tuesday. Patient informed that I would send PCP message and see if she can extend her return to date until Tuesday.

## 2018-08-09 ENCOUNTER — Telehealth: Payer: Self-pay

## 2018-08-09 NOTE — Telephone Encounter (Signed)
Pt  Disability and FMLA forms completed and faxed this morning 08/09/2018 and a confirmation was received.

## 2018-08-13 NOTE — Telephone Encounter (Signed)
I already completed FMLA after her last visit.  She can discuss future FMLA's with psychiatrist. I cannot fill another one for the time she is requesting.  Thanks, BJ

## 2018-08-13 NOTE — Telephone Encounter (Signed)
Forms completed and were faxed on 08/09/2018 by Izora Gala.

## 2018-08-13 NOTE — Telephone Encounter (Signed)
Relation to pt: self Call back number: [phone]    Reason for call:  Patient called back stating she wold like FMLA return back to work to reflect August 11th. Patient states she would like to return back to work after her psychiatrist eval which is scheduled for August 7th, appointment was scheduled a month out do to [place]. Patient would like a follow up call to discuss further.

## 2018-08-13 NOTE — Telephone Encounter (Signed)
Message sent to Dr. Martinique for review and approval. Patient stated that she would have new forms faxed to the office.

## 2018-08-13 NOTE — Telephone Encounter (Signed)
Pt called to request call back from Dr Martinique  asst.  She say she needs to change the dates of the [facility] paperwork before you send.

## 2018-08-14 ENCOUNTER — Encounter: Payer: Self-pay | Admitting: Family Medicine

## 2018-08-14 ENCOUNTER — Other Ambulatory Visit: Payer: Self-pay

## 2018-08-14 ENCOUNTER — Telehealth: Payer: Self-pay | Admitting: *Deleted

## 2018-08-14 ENCOUNTER — Ambulatory Visit (INDEPENDENT_AMBULATORY_CARE_PROVIDER_SITE_OTHER): Payer: 59 | Admitting: Family Medicine

## 2018-08-14 DIAGNOSIS — G4700 Insomnia, unspecified: Secondary | ICD-10-CM | POA: Diagnosis not present

## 2018-08-14 DIAGNOSIS — R197 Diarrhea, unspecified: Secondary | ICD-10-CM

## 2018-08-14 DIAGNOSIS — F419 Anxiety disorder, unspecified: Secondary | ICD-10-CM | POA: Diagnosis not present

## 2018-08-14 MED ORDER — VENLAFAXINE HCL ER 37.5 MG PO CP24: 37.5000 mg | ORAL_CAPSULE | Freq: Every day | ORAL | 1 refills | Status: DC

## 2018-08-14 NOTE — Progress Notes (Signed)
Virtual Visit via Video Note   I connected with Joanna Shaw on 08/14/18 at  2:45 PM EDT by a video enabled telemedicine application and verified that I am speaking with the correct person using two identifiers.  Location patient: home Location provider:home office Persons participating in the virtual visit: patient, provider  I discussed the limitations of evaluation and management by telemedicine and the availability of in person appointments. The patient expressed understanding and agreed to proceed.   HPI: Joanna Joanna Shaw is a 33 yo female with Hx of anxiety, she was last seen on 07/31/18. Last OV she was c/o insomnia,Doxepin was recommended,taking 6 mg and sleeping about 6-7 hours. No side effects reported.  Anxiety: She is not taking Celexa,states that it was" just helping with migraine" and she is not longer having headaches.She did not mention this last visit,she was reporting some improvement of anxiety with medication. She has appt with psychiatrist,09/14/18.  Also c/o diarrhea  For the past 2-3 weeks. She has 2-3 times soft stools per day.  She has not identified exacerbating factors but thinks it may be related to changes in her diet.States that her mother is cooking, different type of food she is use to. Also mentions that stool is "cherry color." Mild nausea Negative for fever,chills,body aches,abdominal pain,vomiting,or urinary symptoms.  No prior Hx of diarrhea. + "Rectal irritation" but no dyschezia.  No recent abx use.  Lab Results  Component Value Date   TSH 1.50 04/29/2016  No sick contact,recent travel. She has not tried OTC medications. She is not sure about exacerbating or alleviating factors.  For several months she has requested FMLA's completed for job accommodations and time off from work due to worsening anxiety. Working from home did not seem to help. Last FMLA completed in 07/2018,she was supposed to go back to work 08/09/18.  Apparently she did not go back  to work as we discussed. Requeting FMLA extension until 09/18/18.  Psych appt 09/14/18.   ROS: See pertinent positives and negatives per HPI.  Past Medical History:  Diagnosis Date  . Abnormal Pap smear 2008  . Chlamydia contact, treated 01/11/2011   Treated/ neg TOC  . Chlamydia contact, treated 01/2011   Treated/ neg TOC  . Chlamydia infection 2006  . Late prenatal care complicating pregnancy   . No pertinent past medical history   . Temporary low platelet count (HCC)    at 28 weeks    Past Surgical History:  Procedure Laterality Date  . CESAREAN SECTION  05/29/2011   Procedure: CESAREAN SECTION;  Surgeon: Purcell NailsAngela Y Roberts, MD;  Location: [facility];  Service: Gynecology;  Laterality: N/A;  Primar cesarean section of baby  girl at 2043 APGAR  . NO PAST SURGERIES      Family History  Problem Relation Age of Onset  . Hypertension Mother   . Alcohol abuse Mother   . Hypertension Maternal Grandmother   . Anesthesia problems Neg Hx   . Hypotension Neg Hx   . Malignant hyperthermia Neg Hx   . Pseudochol deficiency Neg Hx     Social History   Socioeconomic History  . Marital status: Single    Spouse name: Not on file  . Number of children: Not on file  . Years of education: Not on file  . Highest education level: Not on file  Occupational History  . Not on file  Social Needs  . Financial resource strain: Not on file  . Food insecurity    Worry: Not on  file    Inability: Not on file  . Transportation needs    Medical: Not on file    Non-medical: Not on file  Tobacco Use  . Smoking status: Never Smoker  . Smokeless tobacco: Never Used  Substance and Sexual Activity  . Alcohol use: No  . Drug use: No  . Sexual activity: Yes    Partners: Male    Birth control/protection: None  Lifestyle  . Physical activity    Days per week: Not on file    Minutes per session: Not on file  . Stress: Not on file  Relationships  . Social Herbalist on phone: Not on  file    Gets together: Not on file    Attends religious service: Not on file    Active member of club or organization: Not on file    Attends meetings of clubs or organizations: Not on file    Relationship status: Not on file  . Intimate partner violence    Fear of current or ex partner: Not on file    Emotionally abused: Not on file    Physically abused: Not on file    Forced sexual activity: Not on file  Other Topics Concern  . Not on file  Social History Narrative  . Not on file      Current Outpatient Medications:  .  doxepin (SINEQUAN) 10 MG/ML solution, Take 0.3-0.6 mLs (3-6 mg total) by mouth at bedtime., Disp: 30 mL, Rfl: 0 .  medroxyPROGESTERone (DEPO-PROVERA) 150 MG/ML injection, Inject 1 mL (150 mg total) into the muscle every 3 (three) months., Disp: 1 mL, Rfl: 4 .  venlafaxine XR (EFFEXOR XR) 37.5 MG 24 hr capsule, Take 1 capsule (37.5 mg total) by mouth daily with breakfast., Disp: 30 capsule, Rfl: 1  EXAM:  VITALS per patient if applicable:N/A  GENERAL: alert, oriented, appears well and in no acute distress  HEENT: atraumatic, normocephalic,conjunctiva clear.  LUNGS: on inspection no signs of respiratory distress, breathing rate appears normal, no obvious gross SOB, gasping or wheezing  CV: no obvious cyanosis  Joanna: moves all visible extremities without noticeable abnormality  PSYCH/NEURO: Cooperative, no obvious depression or anxiety, speech and thought processing grossly intact  ASSESSMENT AND PLAN:  Discussed the following assessment and plan:  Diarrhea, unspecified type - Plan: CBC, TSH, Sedimentation rate, C-reactive protein Possible etiologies discussed: ? Anxiety,IBS. New problem. ? Blood in stool, "cherry" color. Lab appt will be arranged. For now I do not think stool Cx or GI evaluation are necessary.  Adequate hydration. Instructed about warning signs.   Anxiety disorder After discussion of other pharmacologic treatment options,she  agrees with trying Effexor XR 37.5 mg daily. Some side effects discussed. Keep appt with psychiatrist. Instructed about warning signs.  Insomnia Improved with Doxepin 6 mg at bedtime. Good sleep hygiene. No changes in current management. Continue following with psychiatrist.  FMLA and return to work form were signed and faxed last week, 08/09/18. No further FMLA's will be completed. Job accommodations and/or leaves can be discussed with psychiatrist.   I discussed the assessment and treatment plan with the patient. She was provided an opportunity to ask questions and all were answered. She agreed with the plan and demonstrated an understanding of the instructions.   The patient was advised to call back or seek an in-person evaluation if the symptoms worsen or if the condition fails to improve as anticipated.  Return if symptoms worsen or fail to improve.  Eaven Schwager Martinique, MD

## 2018-08-14 NOTE — Telephone Encounter (Signed)
Patient given recommendations per Dr Martinique. Patient scheduled appointment for 08/14/2018 to discuss with provider the reason she will not extend return to work time.

## 2018-08-14 NOTE — Assessment & Plan Note (Signed)
After discussion of other pharmacologic treatment options,she agrees with trying Effexor XR 37.5 mg daily. Some side effects discussed. Keep appt with psychiatrist. Instructed about warning signs.

## 2018-08-14 NOTE — Assessment & Plan Note (Signed)
Improved with Doxepin 6 mg at bedtime. Good sleep hygiene. No changes in current management. Continue following with psychiatrist.

## 2018-08-14 NOTE — Telephone Encounter (Signed)
Called patient to schedule labs per Dr. Martinique. Patient stated that she did not know when she would be able to come in for labs. Patient asked if I could refax forms to [employer], informed her that they were already refaxed. Patient hung up phone.  FYI sent to Dr. Martinique.

## 2018-09-10 ENCOUNTER — Ambulatory Visit: Payer: 59

## 2018-09-19 ENCOUNTER — Other Ambulatory Visit: Payer: Self-pay

## 2018-09-19 ENCOUNTER — Ambulatory Visit (INDEPENDENT_AMBULATORY_CARE_PROVIDER_SITE_OTHER): Payer: 59 | Admitting: *Deleted

## 2018-09-19 DIAGNOSIS — Z309 Encounter for contraceptive management, unspecified: Secondary | ICD-10-CM

## 2018-09-19 DIAGNOSIS — Z3042 Encounter for surveillance of injectable contraceptive: Secondary | ICD-10-CM | POA: Diagnosis not present

## 2018-09-19 MED ORDER — MEDROXYPROGESTERONE ACETATE 150 MG/ML IM SUSP
150.0000 mg | Freq: Once | INTRAMUSCULAR | Status: AC
  Administered 2018-09-19: 150 mg via INTRAMUSCULAR

## 2018-09-19 NOTE — Progress Notes (Signed)
Per orders of Dr. Jordan, injection of Cyanocobalamin 1000mcg given by Funderburk, Jo A. Patient tolerated injection well.  

## 2018-12-05 ENCOUNTER — Other Ambulatory Visit: Payer: Self-pay

## 2018-12-05 ENCOUNTER — Ambulatory Visit (INDEPENDENT_AMBULATORY_CARE_PROVIDER_SITE_OTHER): Payer: Self-pay | Admitting: *Deleted

## 2018-12-05 DIAGNOSIS — Z3042 Encounter for surveillance of injectable contraceptive: Secondary | ICD-10-CM

## 2018-12-05 MED ORDER — MEDROXYPROGESTERONE ACETATE 150 MG/ML IM SUSY
150.0000 mg | PREFILLED_SYRINGE | Freq: Once | INTRAMUSCULAR | Status: AC
  Administered 2018-12-05: 11:00:00 150 mg via INTRAMUSCULAR

## 2018-12-05 NOTE — Progress Notes (Signed)
Patient in for Depo Injection. Injection administered with no reaction.

## 2019-02-20 ENCOUNTER — Ambulatory Visit (INDEPENDENT_AMBULATORY_CARE_PROVIDER_SITE_OTHER): Payer: 59

## 2019-02-20 ENCOUNTER — Other Ambulatory Visit: Payer: Self-pay

## 2019-02-20 DIAGNOSIS — Z3042 Encounter for surveillance of injectable contraceptive: Secondary | ICD-10-CM | POA: Diagnosis not present

## 2019-02-20 MED ORDER — MEDROXYPROGESTERONE ACETATE 150 MG/ML IM SUSP
150.0000 mg | Freq: Once | INTRAMUSCULAR | Status: AC
  Administered 2019-02-20: 150 mg via INTRAMUSCULAR

## 2019-02-20 NOTE — Progress Notes (Signed)
Pt came into the office for her depo injection, tolerated injection well. Dates given for next injection for pt to schedule appt at front desk prior to leaving.

## 2019-03-01 ENCOUNTER — Other Ambulatory Visit (HOSPITAL_COMMUNITY)
Admission: RE | Admit: 2019-03-01 | Discharge: 2019-03-01 | Disposition: A | Discharge status: Home / Self Care | Payer: 59 | Source: Ambulatory Visit | Attending: Family Medicine | Admitting: Family Medicine

## 2019-03-01 ENCOUNTER — Other Ambulatory Visit: Payer: Self-pay

## 2019-03-01 ENCOUNTER — Encounter: Payer: Self-pay | Admitting: Family Medicine

## 2019-03-01 ENCOUNTER — Ambulatory Visit (INDEPENDENT_AMBULATORY_CARE_PROVIDER_SITE_OTHER): Payer: 59 | Admitting: Family Medicine

## 2019-03-01 VITALS — BP 110/70 | HR 99 | Temp 96.5°F | Resp 12 | Ht 66.0 in | Wt 157.0 lb

## 2019-03-01 DIAGNOSIS — N898 Other specified noninflammatory disorders of vagina: Secondary | ICD-10-CM | POA: Diagnosis not present

## 2019-03-01 DIAGNOSIS — Z113 Encounter for screening for infections with a predominantly sexual mode of transmission: Secondary | ICD-10-CM

## 2019-03-01 DIAGNOSIS — F419 Anxiety disorder, unspecified: Secondary | ICD-10-CM | POA: Diagnosis not present

## 2019-03-01 NOTE — Patient Instructions (Addendum)
A few things to remember from today's visit:   Vaginal discharge - Plan: RPR, HIV Antibody (routine testing w rflx), Hepatitis C antibody, Urine cytology ancillary only  Screen for STD (sexually transmitted disease) - Plan: RPR, HIV Antibody (routine testing w rflx), Hepatitis C antibody, Urine cytology ancillary only  Use of condoms consistently helps to prevent most of STD's.

## 2019-03-01 NOTE — Progress Notes (Signed)
HPI:   Joanna Shaw is a 34 y.o. female, who was initially scheduled for a CPE. Her last CPE was on 06/29/2018. She is not sure if her health insurance will pay for it earlier CP, so she prefers to cancel. She is concerned about STDs, she would like to be screened. She has a new sex partner.  For about a week she has noted non odorous, clear and sometimes whitish discharge. It is intermittent. She has not noted genital edema or erythema.  She has not identified exacerbating or alleviating factors.  + Vaginal pruritus, worse when applies soap on genital area. She has not noted genital lesions/blisters. Irritation after sex inercourse. She has not used OTC medications.  No vaginal bleeding,pelvic pain,urinary symptoms, or dyspareunia. Depo Provera for birth control. LMP usually a week before last Depo.. No condom use.  -Anxiety and insomnia: She is not taking Effexor or Doxepin. Not longer following with psychiatrist. States that she has felt better since she quit her job, so stopped medications.  Review of Systems  Constitutional: Negative for chills, fatigue and fever.  HENT: Negative for mouth sores and sore throat.   Gastrointestinal: Negative for abdominal pain, nausea and vomiting.  Genitourinary: Negative for dysuria, frequency, hematuria and urgency.  Musculoskeletal: Negative for arthralgias and myalgias.  Skin: Negative for rash and wound.  Rest positive and negative ROS in HPI.  Current Outpatient Medications on File Prior to Visit  Medication Sig Dispense Refill  . doxepin (SINEQUAN) 10 MG/ML solution Take 0.3-0.6 mLs (3-6 mg total) by mouth at bedtime. 30 mL 0  . venlafaxine XR (EFFEXOR XR) 37.5 MG 24 hr capsule Take 1 capsule (37.5 mg total) by mouth daily with breakfast. 30 capsule 1   No current facility-administered medications on file prior to visit.    Past Medical History:  Diagnosis Date  . Abnormal Pap smear 2008  .  Chlamydia contact, treated 01/11/2011   Treated/ neg TOC  . Chlamydia contact, treated 01/2011   Treated/ neg TOC  . Chlamydia infection 2006  . Late prenatal care complicating pregnancy   . No pertinent past medical history   . Temporary low platelet count (HCC)    at 28 weeks    Past Surgical History:  Procedure Laterality Date  . CESAREAN SECTION  05/29/2011   Procedure: CESAREAN SECTION;  Surgeon: Delice Lesch, MD;  Location: [facility];  Service: Gynecology;  Laterality: N/A;  Primar cesarean section of baby  girl at 2043 APGAR  . NO PAST SURGERIES      Allergies  Allergen Reactions  . Other     Family History  Problem Relation Age of Onset  . Hypertension Mother   . Alcohol abuse Mother   . Hypertension Maternal Grandmother   . Anesthesia problems Neg Hx   . Hypotension Neg Hx   . Malignant hyperthermia Neg Hx   . Pseudochol deficiency Neg Hx     Social History   Socioeconomic History  . Marital status: Single    Spouse name: Not on file  . Number of children: Not on file  . Years of education: Not on file  . Highest education level: Not on file  Occupational History  . Not on file  Tobacco Use  . Smoking status: Never Smoker  . Smokeless tobacco: Never Used  Substance and Sexual Activity  . Alcohol use: No  . Drug use: No  . Sexual activity: Yes    Partners: Male  Birth control/protection: None  Other Topics Concern  . Not on file  Social History Narrative  . Not on file   Social Determinants of Health   Financial Resource Strain:   . Difficulty of Paying Living Expenses: Not on file  Food Insecurity:   . Worried About Programme researcher, broadcasting/film/video in the Last Year: Not on file  . Ran Out of Food in the Last Year: Not on file  Transportation Needs:   . Lack of Transportation (Medical): Not on file  . Lack of Transportation (Non-Medical): Not on file  Physical Activity:   . Days of Exercise per Week: Not on file  . Minutes of Exercise per Session:  Not on file  Stress:   . Feeling of Stress : Not on file  Social Connections:   . Frequency of Communication with Friends and Family: Not on file  . Frequency of Social Gatherings with Friends and Family: Not on file  . Attends Religious Services: Not on file  . Active Member of Clubs or Organizations: Not on file  . Attends Banker Meetings: Not on file  . Marital Status: Not on file     Vitals:   03/01/19 1042  BP: 110/70  Pulse: 99  Resp: 12  Temp: (!) 96.5 F (35.8 C)  SpO2: 95%   Body mass index is 25.34 kg/m.   Physical Exam  Nursing note and vitals reviewed. Constitutional: She is oriented to person, place, and time. She appears well-developed and well-nourished. No distress.  HENT:  Head: Normocephalic and atraumatic.  Eyes: Pupils are equal, round, and reactive to light. Conjunctivae are normal.  Cardiovascular: Normal rate and regular rhythm.  No murmur heard. Respiratory: Effort normal and breath sounds normal. No respiratory distress.  GI: Soft. She exhibits no mass. There is no abdominal tenderness. There is no CVA tenderness.  Genitourinary:    Genitourinary Comments: Deferred to gyn.   Musculoskeletal:        General: No edema.  Neurological: She is alert and oriented to person, place, and time. She has normal strength. Gait normal.  Skin: Skin is warm. No rash noted. No erythema.  Psychiatric: She has a normal mood and affect.  Well groomed, good eye contact.   ASSESSMENT AND PLAN:  Joanna Shaw was here today c/o vaginal discharge.   Orders Placed This Encounter  Procedures  . RPR  . HIV Antibody (routine testing w rflx)  . Hepatitis C antibody    Vaginal discharge Possible etiologies discussed. Recommend avoiding local irritants as soap. Further recommendations will be given according to urine cytology. If negative will treat as fungal vulvovaginitis.  -     RPR -     HIV Antibody (routine testing w  rflx) -     Hepatitis C antibody -     Urine cytology ancillary only  Screen for STD (sexually transmitted disease) Educated about STD prevention, encouraged condom use. Explained that Depo does not prevent STD transmition.  -     RPR -     HIV Antibody (routine testing w rflx) -     Hepatitis C antibody -     Urine cytology ancillary only   Anxiety disorder, unspecified type Improved. She doe snot think pharmacologic treatment is needed at this time.   No follow-ups on file.   Jennette Leask G. [place], MD  [facility]. [place] office.

## 2019-03-04 LAB — URINE CYTOLOGY ANCILLARY ONLY
Chlamydia: NEGATIVE
Comment: NEGATIVE
Comment: NEGATIVE
Comment: NORMAL
Neisseria Gonorrhea: NEGATIVE
Trichomonas: NEGATIVE

## 2019-03-04 LAB — HEPATITIS C ANTIBODY
Hepatitis C Ab: NONREACTIVE
SIGNAL TO CUT-OFF: 0.02 (ref <1.00)

## 2019-03-04 LAB — HIV ANTIBODY (ROUTINE TESTING W REFLEX): HIV 1&2 Ab, 4th Generation: NONREACTIVE

## 2019-03-04 LAB — RPR: RPR Ser Ql: NONREACTIVE

## 2019-03-06 ENCOUNTER — Encounter: Payer: Self-pay | Admitting: Family Medicine

## 2019-05-08 ENCOUNTER — Ambulatory Visit (INDEPENDENT_AMBULATORY_CARE_PROVIDER_SITE_OTHER): Payer: 59 | Admitting: Family Medicine

## 2019-05-08 ENCOUNTER — Other Ambulatory Visit: Payer: Self-pay

## 2019-05-08 DIAGNOSIS — Z3042 Encounter for surveillance of injectable contraceptive: Secondary | ICD-10-CM

## 2019-05-08 MED ORDER — MEDROXYPROGESTERONE ACETATE 150 MG/ML IM SUSP
150.0000 mg | Freq: Once | INTRAMUSCULAR | Status: AC
  Administered 2019-05-08: 12:00:00 150 mg via INTRAMUSCULAR

## 2019-05-08 NOTE — Progress Notes (Signed)
Per orders of Dr. Betty [place], injection of Depo Provera 150 mg/ml given by Gracy Racer in left deltoid. Patient tolerated injection well. Patient will make appointment for 3 months.

## 2019-07-09 ENCOUNTER — Ambulatory Visit: Payer: 59

## 2019-07-26 ENCOUNTER — Ambulatory Visit: Payer: 59 | Admitting: Family Medicine

## 2019-07-26 ENCOUNTER — Other Ambulatory Visit: Payer: Self-pay

## 2019-07-26 ENCOUNTER — Ambulatory Visit (INDEPENDENT_AMBULATORY_CARE_PROVIDER_SITE_OTHER): Payer: 59 | Admitting: *Deleted

## 2019-07-26 DIAGNOSIS — Z3042 Encounter for surveillance of injectable contraceptive: Secondary | ICD-10-CM | POA: Diagnosis not present

## 2019-07-26 MED ORDER — MEDROXYPROGESTERONE ACETATE 150 MG/ML IM SUSP
150.0000 mg | Freq: Once | INTRAMUSCULAR | Status: AC
  Administered 2019-07-26: 150 mg via INTRAMUSCULAR

## 2019-07-26 NOTE — Progress Notes (Signed)
Per orders of Dr. [place], injection of Depo-Provera given by Jobe Gibbon. Patient tolerated injection well.

## 2019-07-31 ENCOUNTER — Ambulatory Visit: Payer: 59 | Admitting: Family Medicine

## 2019-10-28 ENCOUNTER — Other Ambulatory Visit: Payer: Self-pay

## 2019-10-28 ENCOUNTER — Ambulatory Visit (INDEPENDENT_AMBULATORY_CARE_PROVIDER_SITE_OTHER): Payer: 59

## 2019-10-28 DIAGNOSIS — N926 Irregular menstruation, unspecified: Secondary | ICD-10-CM | POA: Diagnosis not present

## 2019-10-28 DIAGNOSIS — Z3042 Encounter for surveillance of injectable contraceptive: Secondary | ICD-10-CM

## 2019-10-28 MED ORDER — MEDROXYPROGESTERONE ACETATE 150 MG/ML IM SUSP
150.0000 mg | Freq: Once | INTRAMUSCULAR | Status: AC
  Administered 2019-10-28: 150 mg via INTRAMUSCULAR

## 2019-10-28 NOTE — Progress Notes (Signed)
Per orders of Dr. [place], injection of medroxyPROGESTERone 150 mg/mL given by Carola Rhine. Patient tolerated injection well.

## 2019-11-27 LAB — POCT URINE PREGNANCY: Preg Test, Ur: NEGATIVE

## 2020-01-13 ENCOUNTER — Other Ambulatory Visit: Payer: Self-pay

## 2020-01-13 ENCOUNTER — Ambulatory Visit (INDEPENDENT_AMBULATORY_CARE_PROVIDER_SITE_OTHER): Payer: 59

## 2020-01-13 DIAGNOSIS — Z3042 Encounter for surveillance of injectable contraceptive: Secondary | ICD-10-CM | POA: Diagnosis not present

## 2020-01-13 MED ORDER — MEDROXYPROGESTERONE ACETATE 150 MG/ML IM SUSP
150.0000 mg | Freq: Once | INTRAMUSCULAR | Status: AC
  Administered 2020-01-13: 150 mg via INTRAMUSCULAR

## 2020-01-13 NOTE — Progress Notes (Signed)
Pt came in for her depo-provera injection. Injection given in the right deltoid, pt tolerated injection well. Dates of next injection given to pt at time of visit.

## 2020-03-30 ENCOUNTER — Ambulatory Visit: Payer: 59

## 2020-04-03 ENCOUNTER — Other Ambulatory Visit: Payer: Self-pay

## 2020-04-05 NOTE — Progress Notes (Unsigned)
HPI: [name] is a 35 y.o. female, who is here today for her routine physical.  Last CPE: 06/29/18  Regular exercise 3 or more time per week: She is not doing so regularly. Active at work and with chores. Following a healthy diet: She cooks at home, she follows a healthful diet usually. She lives with her 75 yo daughter.  Chronic medical problems: Anxiety,B12 def,and constipation among some.  Anxiety has improved greatly since she quit one of her jobs. 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  Pap smear: 02/17/2017 negative for malignancy. Hx of abnormal pap smears: LGSIL a few years ago. Hx of STD's: Chlamydia M:12 G:1 L:1 Sexually active. She does not use condoms. On depo provera: Will be given today.  Immunization History  Administered Date(s) Administered  . Td 02/13/2005  . Tdap 05/30/2011   Mammogram: N/A Colonoscopy: N/A DEXA: N/A Hep C screening: 03/01/19 NR.  B12 deficiency: She is not on B12 supplementation.  Lab Results  Component Value Date   VITAMINB12 208 (L) 04/26/2017   Review of Systems  Constitutional: Negative for appetite change, fatigue and fever.  HENT: Negative for hearing loss, mouth sores, sore throat, trouble swallowing and voice change.   Eyes: Negative for redness and visual disturbance.  Respiratory: Negative for cough, shortness of breath and wheezing.   Cardiovascular: Negative for chest pain and leg swelling.  Gastrointestinal: Negative for abdominal pain, nausea and vomiting.       No changes in bowel habits.  Endocrine: Negative for cold intolerance, heat intolerance, polydipsia, polyphagia and polyuria.  Genitourinary: Negative for decreased urine volume, dysuria, hematuria, vaginal bleeding and vaginal discharge.  Musculoskeletal: Negative for gait problem and myalgias.  Skin: Negative for color change and rash.  Allergic/Immunologic: Negative for environmental allergies.  Neurological: Negative for syncope,  weakness and headaches.  Hematological: Negative for adenopathy. Does not bruise/bleed easily.  Psychiatric/Behavioral: Negative for confusion.  All other systems reviewed and are negative.  No current outpatient medications on file prior to visit.   No current facility-administered medications on file prior to visit.   Past Medical History:  Diagnosis Date  . Abnormal Pap smear 2008  . Chlamydia contact, treated 01/11/2011   Treated/ neg TOC  . Chlamydia contact, treated 01/2011   Treated/ neg TOC  . Chlamydia infection 2006  . Late prenatal care complicating pregnancy   . No pertinent past medical history   . Temporary low platelet count (HCC)    at 28 weeks   Past Surgical History:  Procedure Laterality Date  . CESAREAN SECTION  05/29/2011   Procedure: CESAREAN SECTION;  Surgeon: Purcell Nails, MD;  Location: [facility];  Service: Gynecology;  Laterality: N/A;  Primar cesarean section of baby  girl at 2043 APGAR  . NO PAST SURGERIES     Allergies  Allergen Reactions  . Other    Family History  Problem Relation Age of Onset  . Hypertension Mother   . Alcohol abuse Mother   . Hypertension Maternal Grandmother   . Anesthesia problems Neg Hx   . Hypotension Neg Hx   . Malignant hyperthermia Neg Hx   . Pseudochol deficiency Neg Hx     Social History   Socioeconomic History  . Marital status: Single    Spouse name: Not on file  . Number of children: Not on file  . Years of education: Not on file  . Highest education level: Not on file  Occupational History  . Not on  file  Tobacco Use  . Smoking status: Never Smoker  . Smokeless tobacco: Never Used  Substance and Sexual Activity  . Alcohol use: No  . Drug use: No  . Sexual activity: Yes    Partners: Male    Birth control/protection: None  Other Topics Concern  . Not on file  Social History Narrative  . Not on file   Social Determinants of Health   Financial Resource Strain: Not on file  Food Insecurity:  Not on file  Transportation Needs: Not on file  Physical Activity: Not on file  Stress: Not on file  Social Connections: Not on file   Vitals:   04/06/20 0853  BP: 110/70  Pulse: 64  Temp: 98.6 F (37 C)  SpO2: 98%   Body mass index is 25.08 kg/m.  Wt Readings from Last 3 Encounters:  04/06/20 155 lb 6 oz (70.5 kg)  03/01/19 157 lb (71.2 kg)  06/29/18 157 lb 2 oz (71.3 kg)   Physical Exam Vitals and nursing note reviewed. Exam conducted with a chaperone present.  Constitutional:      General: She is not in acute distress.    Appearance: She is well-developed.  HENT:     Head: Normocephalic and atraumatic.     Right Ear: Hearing, tympanic membrane, ear canal and external ear normal.     Left Ear: Hearing, tympanic membrane, ear canal and external ear normal.     Mouth/Throat:     Mouth: Oropharynx is clear and moist and mucous membranes are normal.     Pharynx: Uvula midline.  Eyes:     Extraocular Movements: Extraocular movements intact and EOM normal.     Conjunctiva/sclera: Conjunctivae normal.     Pupils: Pupils are equal, round, and reactive to light.  Neck:     Thyroid: No thyroid mass or thyroid tenderness.     Trachea: No tracheal deviation.  Cardiovascular:     Rate and Rhythm: Normal rate and regular rhythm.     Pulses:          Dorsalis pedis pulses are 2+ on the right side and 2+ on the left side.     Heart sounds: No murmur heard.   Pulmonary:     Effort: Pulmonary effort is normal. No respiratory distress.     Breath sounds: Normal breath sounds.  Chest:  Breasts:     Right: No axillary adenopathy or supraclavicular adenopathy.     Left: No axillary adenopathy or supraclavicular adenopathy.    Abdominal:     Palpations: Abdomen is soft. There is no hepatomegaly or mass.     Tenderness: There is no abdominal tenderness.  Genitourinary:    Exam position: Lithotomy position.     Labia:        Right: No rash, tenderness or lesion.        Left:  No rash, tenderness or lesion.      Vagina: Vaginal discharge present. No erythema, tenderness, bleeding or prolapsed vaginal walls.     Cervix: No cervical motion tenderness, discharge, friability, lesion or erythema.     Uterus: Not enlarged and not tender.      Adnexa:        Right: No mass, tenderness or fullness.         Left: No mass, tenderness or fullness.          Comments: Breast: No masses,nipple discharge,or skin changes bilateral. Transformation zone present. Pap smear collected.  Musculoskeletal:  General: No edema.     Comments: No major deformity or signs of synovitis appreciated.  Lymphadenopathy:     Cervical: No cervical adenopathy.     Upper Body:     Right upper body: No supraclavicular or axillary adenopathy.     Left upper body: No supraclavicular or axillary adenopathy.     Lower Body: No right inguinal adenopathy. No left inguinal adenopathy.  Skin:    General: Skin is warm.     Findings: No erythema or rash.  Neurological:     Mental Status: She is alert and oriented to person, place, and time.     Cranial Nerves: No cranial nerve deficit.     Coordination: Coordination normal.     Gait: Gait normal.     Deep Tendon Reflexes: Strength normal.     Reflex Scores:      Bicep reflexes are 2+ on the right side and 2+ on the left side.      Patellar reflexes are 2+ on the right side and 2+ on the left side. Psychiatric:        Mood and Affect: Mood and affect normal.     Comments: Well groomed, good eye contact.   ASSESSMENT AND PLAN:  Ms. Joanna Shaw was here today annual physical examination.  Orders Placed This Encounter  Procedures  . Basic metabolic panel  . Lipid panel  . Vitamin B12   Lab Results  Component Value Date   VITAMINB12 178 (L) 04/06/2020   Lab Results  Component Value Date   CREATININE 0.97 04/06/2020   BUN 11 04/06/2020   NA 140 04/06/2020   K 4.1 04/06/2020   CL 106 04/06/2020   CO2 25 04/06/2020    Lab Results  Component Value Date   CHOL 130 04/06/2020   HDL 52.30 04/06/2020   LDLCALC 68 04/06/2020   TRIG 45.0 04/06/2020   CHOLHDL 2 04/06/2020   Routine general medical examination at a health care facility We discussed the importance of regular physical activity and healthy diet for prevention of chronic illness and/or complications. Preventive guidelines reviewed. Vaccination up to date.  Next CPE in a year.  B12 deficiency Further recommendations according to B12 result.  Screen for STD (sexually transmitted disease) -     Cytology - PAP ([place])  Screening for lipoid disorders -     Lipid panel  Screening for endocrine, metabolic and immunity disorder -     Basic metabolic panel  Cervical cancer screening -     Cytology - PAP ([place])  Encounter for Depo-Provera contraception We discussed some side effects. Appropriate Ca++ intake.  -     medroxyPROGESTERone (DEPO-PROVERA) injection 150 mg   Return in 1 year (on 04/06/2021) for cpe.  Betty G. SwazilandJordan, MD  [facility]. [place] [facility].   Today you have you routine preventive visit. A few things to remember from today's visit:   Routine general medical examination at a health care facility  B12 deficiency - Plan: Vitamin B12  Screen for STD (sexually transmitted disease) - Plan: Cytology - PAP ([place])  Screening for lipoid disorders - Plan: Lipid panel  Screening for endocrine, metabolic and immunity disorder - Plan: Basic metabolic panel  Cervical cancer screening - Plan: Cytology - PAP ([place])  If you need refills please call your pharmacy. Do not use My Chart to request refills or for acute issues that need immediate attention.   Please be sure medication list is accurate. If a new  problem present, please set up appointment sooner than planned today.  At least 150 minutes of moderate exercise per week, daily brisk walking for 15-30 min is a good exercise  option. Healthy diet low in saturated (animal) fats and sweets and consisting of fresh fruits and vegetables, lean meats such as fish and white chicken and whole grains.  These are some of recommendations for screening depending of age and risk factors:  - Vaccines:  Tdap vaccine every 10 years.  Shingles vaccine recommended at age 10, could be given after 33 years of age but not sure about insurance coverage.   Pneumonia vaccines: Pneumovax at 65. Sometimes Pneumovax is giving earlier if history of smoking, lung disease,diabetes,kidney disease among some.  Screening for diabetes at age 58 and every 3 years.  Cervical cancer prevention:  Pap smear starts at 33 years of age and continues periodically until 33 years old in low risk women. Pap smear every 3 years between 47 and 58 years old. Pap smear every 3-5 years between women 30 and older if pap smear negative and HPV screening negative.   -Breast cancer: Mammogram: There is disagreement between experts about when to start screening in low risk asymptomatic female but recent recommendations are to start screening at 16 and not later than 33 years old , every 1-2 years and after 33 yo q 2 years. Screening is recommended until 33 years old but some women can continue screening depending of healthy issues.  Colon cancer screening: Has been recently changed to 33 yo. Insurance may not cover until you are 33 years old. Screening is recommended until 33 years old.  Cholesterol disorder screening at age 103 and every 3 years.  Also recommended:  1. Dental visit- Brush and floss your teeth twice daily; visit your dentist twice a year. 2. Eye doctor- Get an eye exam at least every 2 years. 3. Helmet use- Always wear a helmet when riding a bicycle, motorcycle, rollerblading or skateboarding. 4. Safe sex- If you may be exposed to sexually transmitted infections, use a condom. 5. Seat belts- Seat belts can save your live; always wear  one. 6. Smoke/Carbon Monoxide detectors- These detectors need to be installed on the appropriate level of your home. Replace batteries at least once a year. 7. Skin cancer- When out in the sun please cover up and use sunscreen 15 SPF or higher. 8. Violence- If anyone is threatening or hurting you, please tell your healthcare provider.  9. Drink alcohol in moderation- Limit alcohol intake to one drink or less per day. Never drink and drive. 10. Calcium supplementation 1000 to 1200 mg daily, ideally through your diet.  Vitamin D supplementation 800 units daily.  Next Depo is due May 16th-May 30th.

## 2020-04-06 ENCOUNTER — Other Ambulatory Visit: Payer: Self-pay

## 2020-04-06 ENCOUNTER — Encounter: Payer: Self-pay | Admitting: Family Medicine

## 2020-04-06 ENCOUNTER — Ambulatory Visit (INDEPENDENT_AMBULATORY_CARE_PROVIDER_SITE_OTHER): Payer: 59 | Admitting: Family Medicine

## 2020-04-06 ENCOUNTER — Other Ambulatory Visit (HOSPITAL_COMMUNITY)
Admission: RE | Admit: 2020-04-06 | Discharge: 2020-04-06 | Disposition: A | Discharge status: Home / Self Care | Payer: 59 | Source: Ambulatory Visit | Attending: Family Medicine | Admitting: Family Medicine

## 2020-04-06 VITALS — BP 110/70 | HR 64 | Temp 98.6°F | Ht 66.0 in | Wt 155.4 lb

## 2020-04-06 DIAGNOSIS — Z124 Encounter for screening for malignant neoplasm of cervix: Secondary | ICD-10-CM | POA: Insufficient documentation

## 2020-04-06 DIAGNOSIS — E538 Deficiency of other specified B group vitamins: Secondary | ICD-10-CM | POA: Diagnosis not present

## 2020-04-06 DIAGNOSIS — Z113 Encounter for screening for infections with a predominantly sexual mode of transmission: Secondary | ICD-10-CM | POA: Diagnosis not present

## 2020-04-06 DIAGNOSIS — Z3042 Encounter for surveillance of injectable contraceptive: Secondary | ICD-10-CM

## 2020-04-06 DIAGNOSIS — Z13220 Encounter for screening for lipoid disorders: Secondary | ICD-10-CM | POA: Diagnosis not present

## 2020-04-06 DIAGNOSIS — Z1329 Encounter for screening for other suspected endocrine disorder: Secondary | ICD-10-CM

## 2020-04-06 DIAGNOSIS — Z13228 Encounter for screening for other metabolic disorders: Secondary | ICD-10-CM

## 2020-04-06 DIAGNOSIS — Z0000 Encounter for general adult medical examination without abnormal findings: Secondary | ICD-10-CM

## 2020-04-06 DIAGNOSIS — Z130 Encounter for screening for diseases of the blood and blood-forming organs and certain disorders involving the immune mechanism: Secondary | ICD-10-CM | POA: Diagnosis not present

## 2020-04-06 LAB — BASIC METABOLIC PANEL
BUN: 11 mg/dL (ref 6–23)
CO2: 25 mEq/L (ref 19–32)
Calcium: 9.6 mg/dL (ref 8.4–10.5)
Chloride: 106 mEq/L (ref 96–112)
Creatinine, Ser: 0.97 mg/dL (ref 0.40–1.20)
GFR: 77.4 mL/min (ref >60.00)
Glucose, Bld: 83 mg/dL (ref 70–99)
Potassium: 4.1 mEq/L (ref 3.5–5.1)
Sodium: 140 mEq/L (ref 135–145)

## 2020-04-06 LAB — LIPID PANEL
Cholesterol: 130 mg/dL (ref 0–200)
HDL: 52.3 mg/dL (ref >39.00)
LDL Cholesterol: 68 mg/dL (ref 0–99)
NonHDL: 77.41
Total CHOL/HDL Ratio: 2
Triglycerides: 45 mg/dL (ref 0.0–149.0)
VLDL: 9 mg/dL (ref 0.0–40.0)

## 2020-04-06 LAB — VITAMIN B12: Vitamin B-12: 178 pg/mL — ABNORMAL LOW (ref 211–911)

## 2020-04-06 MED ORDER — MEDROXYPROGESTERONE ACETATE 150 MG/ML IM SUSP
150.0000 mg | Freq: Once | INTRAMUSCULAR | Status: AC
  Administered 2020-04-06: 150 mg via INTRAMUSCULAR

## 2020-04-06 NOTE — Patient Instructions (Addendum)
Today you have you routine preventive visit. A few things to remember from today's visit:   Routine general medical examination at a health care facility  B12 deficiency - Plan: Vitamin B12  Screen for STD (sexually transmitted disease) - Plan: Cytology - PAP ([place])  Screening for lipoid disorders - Plan: Lipid panel  Screening for endocrine, metabolic and immunity disorder - Plan: Basic metabolic panel  Cervical cancer screening - Plan: Cytology - PAP ([place])  If you need refills please call your pharmacy. Do not use My Chart to request refills or for acute issues that need immediate attention.   Please be sure medication list is accurate. If a new problem present, please set up appointment sooner than planned today.  At least 150 minutes of moderate exercise per week, daily brisk walking for 15-30 min is a good exercise option. Healthy diet low in saturated (animal) fats and sweets and consisting of fresh fruits and vegetables, lean meats such as fish and white chicken and whole grains.  These are some of recommendations for screening depending of age and risk factors:  - Vaccines:  Tdap vaccine every 10 years.  Shingles vaccine recommended at age 102, could be given after 33 years of age but not sure about insurance coverage.   Pneumonia vaccines: Pneumovax at 65. Sometimes Pneumovax is giving earlier if history of smoking, lung disease,diabetes,kidney disease among some.  Screening for diabetes at age 32 and every 3 years.  Cervical cancer prevention:  Pap smear starts at 33 years of age and continues periodically until 33 years old in low risk women. Pap smear every 3 years between 30 and 28 years old. Pap smear every 3-5 years between women 30 and older if pap smear negative and HPV screening negative.   -Breast cancer: Mammogram: There is disagreement between experts about when to start screening in low risk asymptomatic female but recent recommendations  are to start screening at 51 and not later than 33 years old , every 1-2 years and after 33 yo q 2 years. Screening is recommended until 33 years old but some women can continue screening depending of healthy issues.  Colon cancer screening: Has been recently changed to 33 yo. Insurance may not cover until you are 33 years old. Screening is recommended until 33 years old.  Cholesterol disorder screening at age 73 and every 3 years.  Also recommended:  1. Dental visit- Brush and floss your teeth twice daily; visit your dentist twice a year. 2. Eye doctor- Get an eye exam at least every 2 years. 3. Helmet use- Always wear a helmet when riding a bicycle, motorcycle, rollerblading or skateboarding. 4. Safe sex- If you may be exposed to sexually transmitted infections, use a condom. 5. Seat belts- Seat belts can save your live; always wear one. 6. Smoke/Carbon Monoxide detectors- These detectors need to be installed on the appropriate level of your home. Replace batteries at least once a year. 7. Skin cancer- When out in the sun please cover up and use sunscreen 15 SPF or higher. 8. Violence- If anyone is threatening or hurting you, please tell your healthcare provider.  9. Drink alcohol in moderation- Limit alcohol intake to one drink or less per day. Never drink and drive. 10. Calcium supplementation 1000 to 1200 mg daily, ideally through your diet.  Vitamin D supplementation 800 units daily.  Next Depo is due May 16th-May 30th.

## 2020-04-10 ENCOUNTER — Encounter: Payer: Self-pay | Admitting: Family Medicine

## 2020-04-10 LAB — CYTOLOGY - PAP
Chlamydia: NEGATIVE
Comment: NEGATIVE
Comment: NEGATIVE
Comment: NEGATIVE
Comment: NORMAL
High risk HPV: NEGATIVE
Neisseria Gonorrhea: NEGATIVE
Trichomonas: NEGATIVE

## 2020-04-11 ENCOUNTER — Encounter: Payer: Self-pay | Admitting: Family Medicine

## 2020-06-22 ENCOUNTER — Ambulatory Visit (INDEPENDENT_AMBULATORY_CARE_PROVIDER_SITE_OTHER): Payer: 59

## 2020-06-22 ENCOUNTER — Other Ambulatory Visit: Payer: Self-pay

## 2020-06-22 DIAGNOSIS — Z3042 Encounter for surveillance of injectable contraceptive: Secondary | ICD-10-CM

## 2020-06-22 MED ORDER — MEDROXYPROGESTERONE ACETATE 150 MG/ML IM SUSP
150.0000 mg | Freq: Once | INTRAMUSCULAR | Status: AC
  Administered 2020-06-22: 150 mg via INTRAMUSCULAR

## 2020-06-22 NOTE — Progress Notes (Signed)
Per orders of Dr.Banks, injection of Depo-provera given by Kathreen Devoid. Patient tolerated injection well.

## 2020-09-07 ENCOUNTER — Other Ambulatory Visit: Payer: Self-pay

## 2020-09-07 ENCOUNTER — Ambulatory Visit (INDEPENDENT_AMBULATORY_CARE_PROVIDER_SITE_OTHER): Payer: 59

## 2020-09-07 DIAGNOSIS — Z3042 Encounter for surveillance of injectable contraceptive: Secondary | ICD-10-CM | POA: Diagnosis not present

## 2020-09-07 MED ORDER — MEDROXYPROGESTERONE ACETATE 150 MG/ML IM SUSP
150.0000 mg | Freq: Once | INTRAMUSCULAR | Status: AC
  Administered 2020-09-07: 150 mg via INTRAMUSCULAR

## 2020-09-07 NOTE — Progress Notes (Signed)
Per orders of Dr Swaziland, pt given medroxyPROGESTERone by Steffanie Rainwater, pt tolerated well.

## 2020-11-30 ENCOUNTER — Other Ambulatory Visit: Payer: Self-pay

## 2020-11-30 ENCOUNTER — Ambulatory Visit (INDEPENDENT_AMBULATORY_CARE_PROVIDER_SITE_OTHER): Payer: 59

## 2020-11-30 DIAGNOSIS — Z3042 Encounter for surveillance of injectable contraceptive: Secondary | ICD-10-CM | POA: Diagnosis not present

## 2020-11-30 MED ORDER — MEDROXYPROGESTERONE ACETATE 150 MG/ML IM SUSP
150.0000 mg | Freq: Once | INTRAMUSCULAR | Status: AC
Start: 2020-11-30 — End: 2020-11-30
  Administered 2020-11-30: 150 mg via INTRAMUSCULAR

## 2020-11-30 NOTE — Progress Notes (Signed)
Pt here for Depo Provera injection per Dr Swaziland.  Last depo provera injection 09/07/20.  Depo Provera 150mg  given IM in right deltoid per pt request and pt tolerated injection well.  Next Depo Provera injection scheduled for 1/16/2.3

## 2021-02-22 ENCOUNTER — Ambulatory Visit: Payer: 59

## 2021-03-03 ENCOUNTER — Ambulatory Visit: Payer: Self-pay | Admitting: Family Medicine

## 2021-03-03 ENCOUNTER — Ambulatory Visit (INDEPENDENT_AMBULATORY_CARE_PROVIDER_SITE_OTHER): Payer: Self-pay

## 2021-03-03 DIAGNOSIS — Z3042 Encounter for surveillance of injectable contraceptive: Secondary | ICD-10-CM

## 2021-03-03 MED ORDER — MEDROXYPROGESTERONE ACETATE 150 MG/ML IM SUSP
150.0000 mg | Freq: Once | INTRAMUSCULAR | Status: AC
  Administered 2021-03-03: 09:00:00 150 mg via INTRAMUSCULAR

## 2021-03-03 NOTE — Progress Notes (Signed)
Per orders of Dr. Swaziland, injection of Depo-Provera given by Kathreen Devoid. Patient tolerated injection well. Patient is currently on her cycle and was aware that she is 2 days past due for her injection; unable to provide urine sample.

## 2021-05-24 ENCOUNTER — Ambulatory Visit (INDEPENDENT_AMBULATORY_CARE_PROVIDER_SITE_OTHER): Payer: 59

## 2021-05-24 DIAGNOSIS — Z3042 Encounter for surveillance of injectable contraceptive: Secondary | ICD-10-CM | POA: Diagnosis not present

## 2021-05-24 MED ORDER — MEDROXYPROGESTERONE ACETATE 150 MG/ML IM SUSP
150.0000 mg | Freq: Once | INTRAMUSCULAR | Status: AC
Start: 2021-05-24 — End: 2021-05-24
  Administered 2021-05-24: 150 mg via INTRAMUSCULAR

## 2021-05-24 NOTE — Progress Notes (Signed)
Joanna Shaw is a 36 y.o. female presents to the office today for Depo Provera injections per Dr Elvis Coil orders. ?Original order: Depo Provera injections every 3 months ?medroxyPROGESTERone 150mg  IM was administered left deltoid today. Patient tolerated injection. ?Patient due for follow up labs/provider appt: Yes. Date due: 03/2021, appt made Yes ?Patient next injection due: 08/23/21, appt made Yes ? ?08/25/21  ? ?

## 2021-08-13 NOTE — Progress Notes (Unsigned)
HPI: Joanna Shaw is a 36 y.o. female, who is here today for her routine physical.  Last CPE: 04/06/20  She exercises regularly, she goes to the gym regularly. In general she follows a healthful diet, she does not eat red meat, she eats plenty of vegetables, [place], and seafood.  Chronic medical problems: Anxiety, headaches, B12 def,and insomnia among some. B12 deficiency on B12 supplementation.  Immunization History  Administered Date(s) Administered   Td 02/13/2005   Tdap 05/30/2011, 08/16/2021   Health Maintenance  Topic Date Due   COVID-19 Vaccine (1) 09/01/2021 (Originally 05/25/1986)   INFLUENZA VACCINE  09/07/2021   PAP SMEAR-Modifier  04/06/2025   TETANUS/TDAP  08/17/2031   Hepatitis C Screening  Completed   HIV Screening  Completed   HPV VACCINES  Aged Out   Pap smear: 04/06/20 LSIL. HPV negative. Hx of abnormal pap smears: LGSIL a few years ago. Hx of STD's: Chlamydia M:12 G:1 L:1 She has not been sexually active sine 12/2020.  She has concerns today. Left calf cramps, attributed to vein disease, she would like to see a vein specialist. Problem has been going on for a while, getting worse, and it is exacerbated by prolonged standing.  Review of Systems  Constitutional:  Negative for appetite change, fatigue and fever.  HENT:  Negative for dental problem, hearing loss, mouth sores, sore throat, trouble swallowing and voice change.   Eyes:  Negative for redness and visual disturbance.  Respiratory:  Negative for cough, shortness of breath and wheezing.   Cardiovascular:  Negative for chest pain and leg swelling.  Gastrointestinal:  Negative for abdominal pain, nausea and vomiting.       No changes in bowel habits.  Endocrine: Negative for cold intolerance, heat intolerance, polydipsia, polyphagia and polyuria.  Genitourinary:  Negative for decreased urine volume, dysuria, hematuria, vaginal bleeding and vaginal discharge.  Musculoskeletal:   Negative for arthralgias, gait problem and myalgias.  Skin:  Negative for color change and rash.  Allergic/Immunologic: Negative for environmental allergies.  Neurological:  Negative for syncope, weakness and headaches.  Hematological:  Negative for adenopathy. Does not bruise/bleed easily.  Psychiatric/Behavioral:  Negative for confusion and sleep disturbance. The patient is not nervous/anxious.   All other systems reviewed and are negative.  No current outpatient medications on file prior to visit.   No current facility-administered medications on file prior to visit.   Past Medical History:  Diagnosis Date   Abnormal Pap smear 2008   Chlamydia contact, treated 01/11/2011   Treated/ neg TOC   Chlamydia contact, treated 01/2011   Treated/ neg TOC   Chlamydia infection 2006   Late prenatal care complicating pregnancy    No pertinent past medical history    Temporary low platelet count (HCC)    at 28 weeks   Past Surgical History:  Procedure Laterality Date   CESAREAN SECTION  05/29/2011   Procedure: CESAREAN SECTION;  Surgeon: Purcell Nails, MD;  Location: [facility];  Service: Gynecology;  Laterality: N/A;  Primar cesarean section of baby  girl at 2043 APGAR   NO PAST SURGERIES     Allergies  Allergen Reactions   Other    Family History  Problem Relation Age of Onset   Hypertension Mother    Alcohol abuse Mother    Hypertension Maternal Grandmother    Anesthesia problems Neg Hx    Hypotension Neg Hx    Malignant hyperthermia Neg Hx    Pseudochol deficiency Neg Hx    Social  History   Socioeconomic History   Marital status: Single    Spouse name: Not on file   Number of children: Not on file   Years of education: Not on file   Highest education level: Not on file  Occupational History   Not on file  Tobacco Use   Smoking status: Never   Smokeless tobacco: Never  Substance and Sexual Activity   Alcohol use: No   Drug use: No   Sexual activity: Yes     Partners: Male    Birth control/protection: None  Other Topics Concern   Not on file  Social History Narrative   Not on file   Social Determinants of Health   Financial Resource Strain: Not on file  Food Insecurity: Not on file  Transportation Needs: Not on file  Physical Activity: Not on file  Stress: Not on file  Social Connections: Not on file   Vitals:   08/16/21 0911  BP: 118/70  Pulse: 75  Resp: 12  SpO2: 99%   Body mass index is 25.56 kg/m.  Wt Readings from Last 3 Encounters:  08/16/21 158 lb 6 oz (71.8 kg)  04/06/20 155 lb 6 oz (70.5 kg)  03/01/19 157 lb (71.2 kg)   Physical Exam Vitals and nursing note reviewed. Exam conducted with a chaperone present.  Constitutional:      General: She is not in acute distress.    Appearance: She is well-developed.  HENT:     Head: Normocephalic and atraumatic.     Right Ear: Hearing, tympanic membrane, ear canal and external ear normal.     Left Ear: Hearing, tympanic membrane, ear canal and external ear normal.     Mouth/Throat:     Mouth: Mucous membranes are moist.     Pharynx: Oropharynx is clear. Uvula midline.  Eyes:     Extraocular Movements: Extraocular movements intact.     Conjunctiva/sclera: Conjunctivae normal.     Pupils: Pupils are equal, round, and reactive to light.  Neck:     Thyroid: No thyromegaly.     Trachea: No tracheal deviation.  Cardiovascular:     Rate and Rhythm: Normal rate and regular rhythm.     Pulses:          Dorsalis pedis pulses are 2+ on the right side and 2+ on the left side.     Heart sounds: No murmur heard.    Comments: Tortuous varicose veins left calf. Pulmonary:     Effort: Pulmonary effort is normal. No respiratory distress.     Breath sounds: Normal breath sounds.  Chest:     Comments: No concerns. Abdominal:     Palpations: Abdomen is soft. There is no hepatomegaly or mass.     Tenderness: There is no abdominal tenderness.  Genitourinary:    Exam position:  Lithotomy position.     Labia:        Right: No rash, tenderness or lesion.        Left: No rash, tenderness or lesion.      Vagina: No signs of injury and foreign body. No vaginal discharge, erythema, tenderness, bleeding or lesions.     Cervix: Discharge (Small amount, whitish, no odorous.) and friability present. No cervical motion tenderness or erythema.     Uterus: Not enlarged and not tender.      Adnexa:        Right: No mass, tenderness or fullness.         Left: No mass, tenderness  or fullness.       Comments: Transformation zone seen. Pap smear collected. Musculoskeletal:     Right lower leg: No edema.     Left lower leg: No edema.     Comments: No major deformity or signs of synovitis appreciated.  Lymphadenopathy:     Cervical: No cervical adenopathy.     Upper Body:     Right upper body: No supraclavicular adenopathy.     Left upper body: No supraclavicular adenopathy.     Lower Body: No right inguinal adenopathy. No left inguinal adenopathy.  Skin:    General: Skin is warm.     Findings: No erythema or rash.  Neurological:     General: No focal deficit present.     Mental Status: She is alert and oriented to person, place, and time.     Cranial Nerves: No cranial nerve deficit.     Coordination: Coordination normal.     Gait: Gait normal.     Deep Tendon Reflexes:     Reflex Scores:      Bicep reflexes are 2+ on the right side and 2+ on the left side.      Patellar reflexes are 2+ on the right side and 2+ on the left side. Psychiatric:        Speech: Speech normal.     Comments: Well groomed, good eye contact.   ASSESSMENT AND PLAN:  Joanna Shaw was here today annual physical examination.  Orders Placed This Encounter  Procedures   Tdap vaccine greater than or equal to 7yo IM   Ambulatory referral to Vascular Surgery   Routine general medical examination at a health care facility We discussed the importance of regular physical activity  and healthy diet for prevention of chronic illness and/or complications. Preventive guidelines reviewed. Vaccination updated. She prefers to hold on blood work today. Next CPE in a year.  Encounter for Depo-Provera contraception She has tolerated medication well. No changes in current management.  -     medroxyPROGESTERone (DEPO-PROVERA) injection 150 mg  LGSIL on Pap smear of cervix -     Cytology - PAP ([place])  Varicose veins of left lower extremity with pain Problem is getting worse. Referral to vein specialist placed as requested.  Cervical cancer screening -     Cytology - PAP ([place])  Need for Tdap vaccination -     Tdap vaccine greater than or equal to 7yo IM  Return in 1 year (on 08/17/2022) for CPE.  Jawon Dipiero G. [place], MD  [facility]. [place] office.

## 2021-08-16 ENCOUNTER — Other Ambulatory Visit (HOSPITAL_COMMUNITY)
Admission: RE | Admit: 2021-08-16 | Discharge: 2021-08-16 | Disposition: A | Discharge status: Home / Self Care | Payer: 59 | Source: Ambulatory Visit | Attending: Family Medicine | Admitting: Family Medicine

## 2021-08-16 ENCOUNTER — Encounter: Payer: Self-pay | Admitting: Family Medicine

## 2021-08-16 ENCOUNTER — Ambulatory Visit (INDEPENDENT_AMBULATORY_CARE_PROVIDER_SITE_OTHER): Payer: 59 | Admitting: Family Medicine

## 2021-08-16 VITALS — BP 118/70 | HR 75 | Resp 12 | Ht 66.0 in | Wt 158.4 lb

## 2021-08-16 DIAGNOSIS — Z3042 Encounter for surveillance of injectable contraceptive: Secondary | ICD-10-CM | POA: Diagnosis not present

## 2021-08-16 DIAGNOSIS — Z124 Encounter for screening for malignant neoplasm of cervix: Secondary | ICD-10-CM

## 2021-08-16 DIAGNOSIS — R87612 Low grade squamous intraepithelial lesion on cytologic smear of cervix (LGSIL): Secondary | ICD-10-CM | POA: Diagnosis present

## 2021-08-16 DIAGNOSIS — Z0000 Encounter for general adult medical examination without abnormal findings: Secondary | ICD-10-CM

## 2021-08-16 DIAGNOSIS — Z13220 Encounter for screening for lipoid disorders: Secondary | ICD-10-CM

## 2021-08-16 DIAGNOSIS — Z23 Encounter for immunization: Secondary | ICD-10-CM

## 2021-08-16 DIAGNOSIS — I83812 Varicose veins of left lower extremities with pain: Secondary | ICD-10-CM

## 2021-08-16 DIAGNOSIS — Z13228 Encounter for screening for other metabolic disorders: Secondary | ICD-10-CM

## 2021-08-16 MED ORDER — MEDROXYPROGESTERONE ACETATE 150 MG/ML IM SUSP
150.0000 mg | Freq: Once | INTRAMUSCULAR | Status: AC
  Administered 2021-08-16: 150 mg via INTRAMUSCULAR

## 2021-08-16 NOTE — Patient Instructions (Addendum)
A few things to remember from today's visit:  Routine general medical examination at a health care facility  Encounter for Depo-Provera contraception  LGSIL on Pap smear of cervix - Plan: Cytology - PAP ([place])  Varicose veins of left lower extremity with pain - Plan: Ambulatory referral to Vascular Surgery  Cervical cancer screening - Plan: Cytology - PAP ([place]) Do not use My Chart to request refills or for acute issues that need immediate attention.   Please be sure medication list is accurate. If a new problem present, please set up appointment sooner than planned today.  Health Maintenance, Female Adopting a healthy lifestyle and getting preventive care are important in promoting health and wellness. Ask your health care provider about: The right schedule for you to have regular tests and exams. Things you can do on your own to prevent diseases and keep yourself healthy. What should I know about diet, weight, and exercise? Eat a healthy diet  Eat a diet that includes plenty of vegetables, fruits, low-fat dairy products, and lean protein. Do not eat a lot of foods that are high in solid fats, added sugars, or sodium. Maintain a healthy weight Body mass index (BMI) is used to identify weight problems. It estimates body fat based on height and weight. Your health care provider can help determine your BMI and help you achieve or maintain a healthy weight. Get regular exercise Get regular exercise. This is one of the most important things you can do for your health. Most adults should: Exercise for at least 150 minutes each week. The exercise should increase your heart rate and make you sweat (moderate-intensity exercise). Do strengthening exercises at least twice a week. This is in addition to the moderate-intensity exercise. Spend less time sitting. Even light physical activity can be beneficial. Watch cholesterol and blood lipids Have your blood tested for lipids and  cholesterol at 34 years of age, then have this test every 5 years. Have your cholesterol levels checked more often if: Your lipid or cholesterol levels are high. You are older than 34 years of age. You are at high risk for heart disease. What should I know about cancer screening? Depending on your health history and family history, you may need to have cancer screening at various ages. This may include screening for: Breast cancer. Cervical cancer. Colorectal cancer. Skin cancer. Lung cancer. What should I know about heart disease, diabetes, and high blood pressure? Blood pressure and heart disease High blood pressure causes heart disease and increases the risk of stroke. This is more likely to develop in people who have high blood pressure readings or are overweight. Have your blood pressure checked: Every 3-5 years if you are 43-52 years of age. Every year if you are 83 years old or older. Diabetes Have regular diabetes screenings. This checks your fasting blood sugar level. Have the screening done: Once every three years after age 84 if you are at a normal weight and have a low risk for diabetes. More often and at a younger age if you are overweight or have a high risk for diabetes. What should I know about preventing infection? Hepatitis B If you have a higher risk for hepatitis B, you should be screened for this virus. Talk with your health care provider to find out if you are at risk for hepatitis B infection. Hepatitis C Testing is recommended for: Everyone born from [birth year] through 1963. Anyone with known risk factors for hepatitis C. Sexually transmitted infections (STIs) Get screened  for STIs, including gonorrhea and chlamydia, if: You are sexually active and are younger than 34 years of age. You are older than 34 years of age and your health care provider tells you that you are at risk for this type of infection. Your sexual activity has changed since you were last screened,  and you are at increased risk for chlamydia or gonorrhea. Ask your health care provider if you are at risk. Ask your health care provider about whether you are at high risk for HIV. Your health care provider may recommend a prescription medicine to help prevent HIV infection. If you choose to take medicine to prevent HIV, you should first get tested for HIV. You should then be tested every 3 months for as long as you are taking the medicine. Pregnancy If you are about to stop having your period (premenopausal) and you may become pregnant, seek counseling before you get pregnant. Take 400 to 800 micrograms (mcg) of folic acid every day if you become pregnant. Ask for birth control (contraception) if you want to prevent pregnancy. Osteoporosis and menopause Osteoporosis is a disease in which the bones lose minerals and strength with aging. This can result in bone fractures. If you are 79 years old or older, or if you are at risk for osteoporosis and fractures, ask your health care provider if you should: Be screened for bone loss. Take a calcium or vitamin D supplement to lower your risk of fractures. Be given hormone replacement therapy (HRT) to treat symptoms of menopause. Follow these instructions at home: Alcohol use Do not drink alcohol if: Your health care provider tells you not to drink. You are pregnant, may be pregnant, or are planning to become pregnant. If you drink alcohol: Limit how much you have to: 0-1 drink a day. Know how much alcohol is in your drink. In the [place] one drink equals one 12 oz bottle of beer (355 mL), one 5 oz glass of wine (148 mL), or one 1 oz glass of hard liquor (44 mL). Lifestyle Do not use any products that contain nicotine or tobacco. These products include cigarettes, chewing tobacco, and vaping devices, such as e-cigarettes. If you need help quitting, ask your health care provider. Do not use street drugs. Do not share needles. Ask your health care  provider for help if you need support or information about quitting drugs. General instructions Schedule regular health, dental, and eye exams. Stay current with your vaccines. Tell your health care provider if: You often feel depressed. You have ever been abused or do not feel safe at home. Summary Adopting a healthy lifestyle and getting preventive care are important in promoting health and wellness. Follow your health care provider's instructions about healthy diet, exercising, and getting tested or screened for diseases. Follow your health care provider's instructions on monitoring your cholesterol and blood pressure. This information is not intended to replace advice given to you by your health care provider. Make sure you discuss any questions you have with your health care provider. Document Revised: 06/15/2020 Document Reviewed: 06/15/2020 Elsevier Patient Education  2023 [employer].

## 2021-08-19 LAB — CYTOLOGY - PAP
Comment: NEGATIVE
Diagnosis: NEGATIVE
High risk HPV: NEGATIVE

## 2022-04-12 ENCOUNTER — Ambulatory Visit (INDEPENDENT_AMBULATORY_CARE_PROVIDER_SITE_OTHER): Payer: Self-pay

## 2022-04-12 ENCOUNTER — Other Ambulatory Visit (HOSPITAL_COMMUNITY)
Admission: RE | Admit: 2022-04-12 | Discharge: 2022-04-12 | Disposition: A | Discharge status: Home / Self Care | Payer: Self-pay | Source: Ambulatory Visit | Attending: Family Medicine | Admitting: Family Medicine

## 2022-04-12 DIAGNOSIS — Z3042 Encounter for surveillance of injectable contraceptive: Secondary | ICD-10-CM

## 2022-04-12 DIAGNOSIS — Z113 Encounter for screening for infections with a predominantly sexual mode of transmission: Secondary | ICD-10-CM

## 2022-04-12 LAB — POCT URINE PREGNANCY: Preg Test, Ur: NEGATIVE

## 2022-04-12 MED ORDER — MEDROXYPROGESTERONE ACETATE 150 MG/ML IM SUSP
150.0000 mg | INTRAMUSCULAR | Status: AC
Start: 2022-04-12 — End: ?
  Administered 2022-04-12: 150 mg via INTRAMUSCULAR

## 2022-04-12 NOTE — Progress Notes (Signed)
Patient came in for her Depo-provera injection. Patient was late, so POC pregnancy test performed and it was negative. Patient tolerated injection well. Pt also requested STD screening with urine sample that was given, order placed. Patient's last OV with PCP was 08/16/21 for her physical.

## 2022-04-13 LAB — URINE CYTOLOGY ANCILLARY ONLY
Chlamydia: NEGATIVE
Comment: NEGATIVE
Comment: NEGATIVE
Comment: NORMAL
Neisseria Gonorrhea: NEGATIVE
Trichomonas: NEGATIVE

## 2022-06-27 ENCOUNTER — Ambulatory Visit: Payer: Self-pay | Admitting: Family Medicine

## 2022-07-11 ENCOUNTER — Other Ambulatory Visit: Payer: Self-pay

## 2022-07-11 ENCOUNTER — Ambulatory Visit (INDEPENDENT_AMBULATORY_CARE_PROVIDER_SITE_OTHER): Payer: 59

## 2022-07-11 DIAGNOSIS — Z3042 Encounter for surveillance of injectable contraceptive: Secondary | ICD-10-CM | POA: Diagnosis not present

## 2022-07-11 MED ORDER — MEDROXYPROGESTERONE ACETATE 150 MG/ML IM SUSP: 150.0000 mg | INTRAMUSCULAR | 0 refills | Status: DC

## 2022-07-11 MED ORDER — MEDROXYPROGESTERONE ACETATE 150 MG/ML IM SUSY
PREFILLED_SYRINGE | Freq: Once | INTRAMUSCULAR | Status: AC
Start: 2022-07-11 — End: 2022-07-11
  Administered 2022-07-11: 1 mL via INTRAMUSCULAR

## 2022-07-11 NOTE — Progress Notes (Signed)
Per orders of Dr. Salomon Fick, injection of MedroxyPROGESTERone Acetate Inj. 150mg /mL  given by Vickii Chafe on R deltoid per pt request. Patient tolerated injection well.   Scheduled pt an appt for next injection for September 27, 2022.

## 2022-07-13 ENCOUNTER — Ambulatory Visit: Payer: 59 | Admitting: Family Medicine

## 2022-07-15 ENCOUNTER — Ambulatory Visit: Payer: 59 | Admitting: Family Medicine

## 2022-07-20 NOTE — Progress Notes (Signed)
ACUTE VISIT Chief Complaint  Patient presents with   urine odor    X a month, has been working out more and has a new sexual partner. No discharge. Occurs when she drinks anything other than water.    HPI: [name] is a 37 y.o. female, who is here today complaining of odorous urine for the past month. She reports no abnormal vaginal discharge or smell, but has noticed that when she drinks anything other than water, her urine has a high ammonia smell.  Problem is intermittent. She denies any dysuria, gross hematuria, urgency, or increased urinary frequency.  Negative for abdominal pain, nausea, vomiting, changes in bowel habits, back pain, abnormal vaginal bleeding,or edema. No known history of frequent UTIs or pyelonephritis. She has had chlamydia in the past.  She has been exercising more recently and is in a monogamous relationship for the past six months.  She is currently on her second depo shot after taking a break for a year. She has had a recent menstrual period, 3-4 weeks ago.  Review of Systems  Constitutional:  Negative for activity change, appetite change, chills and fever.  HENT:  Negative for mouth sores and sore throat.   Respiratory:  Negative for cough and wheezing.   Cardiovascular:  Negative for chest pain.  Endocrine: Negative for polydipsia, polyphagia and polyuria.  Genitourinary:  Negative for dyspareunia, flank pain and genital sores.  Musculoskeletal:  Negative for arthralgias and myalgias.  Skin:  Negative for rash.  See other pertinent positives and negatives in HPI.  Current Outpatient Medications on File Prior to Visit  Medication Sig Dispense Refill   medroxyPROGESTERone (DEPO-PROVERA) 150 MG/ML injection Inject 1 mL (150 mg total) into the muscle every 3 (three) months. 1 mL 0   Current Facility-Administered Medications on File Prior to Visit  Medication Dose Route Frequency Provider Last Rate Last Admin   medroxyPROGESTERone  (DEPO-PROVERA) injection 150 mg  150 mg Intramuscular Q90 days [place], Avalee Castrellon G, MD   150 mg at 04/12/22 1610    Past Medical History:  Diagnosis Date   Abnormal Pap smear 2008   Chlamydia contact, treated 01/11/2011   Treated/ neg TOC   Chlamydia contact, treated 01/2011   Treated/ neg TOC   Chlamydia infection 2006   Late prenatal care complicating pregnancy    No pertinent past medical history    Temporary low platelet count (HCC)    at 28 weeks   Allergies  Allergen Reactions   Other     Social History   Socioeconomic History   Marital status: Single    Spouse name: Not on file   Number of children: Not on file   Years of education: Not on file   Highest education level: Not on file  Occupational History   Not on file  Tobacco Use   Smoking status: Never   Smokeless tobacco: Never  Substance and Sexual Activity   Alcohol use: No   Drug use: No   Sexual activity: Yes    Partners: Male    Birth control/protection: None  Other Topics Concern   Not on file  Social History Narrative   Not on file   Social Determinants of Health   Financial Resource Strain: Not on file  Food Insecurity: Not on file  Transportation Needs: Not on file  Physical Activity: Not on file  Stress: Not on file  Social Connections: Not on file    Vitals:   07/22/22 1045  BP: 110/70  Pulse: 67  Resp: 12  Temp: 98.7 F (37.1 C)  SpO2: 98%   Body mass index is 24.55 kg/m.  Physical Exam Vitals and nursing note reviewed.  Constitutional:      General: She is not in acute distress.    Appearance: She is well-developed.  HENT:     Head: Normocephalic and atraumatic.     Mouth/Throat:     Mouth: Mucous membranes are moist.     Pharynx: Oropharynx is clear.  Eyes:     Conjunctiva/sclera: Conjunctivae normal.  Cardiovascular:     Rate and Rhythm: Normal rate and regular rhythm.  Pulmonary:     Effort: Pulmonary effort is normal. No respiratory distress.     Breath sounds:  Normal breath sounds.  Abdominal:     Palpations: Abdomen is soft. There is no mass.     Tenderness: There is no abdominal tenderness. There is no right CVA tenderness or left CVA tenderness.  Lymphadenopathy:     Cervical: No cervical adenopathy.  Skin:    General: Skin is warm.     Findings: No erythema.  Neurological:     Mental Status: She is alert and oriented to person, place, and time.  Psychiatric:        Mood and Affect: Mood and affect normal.   ASSESSMENT AND PLAN:  [name] was seen today for odorous urine and STD screening.  Foul smelling urine We discussed possible etiologies. History does not suggest a serious process. Urine dipstick done today with traces of blood and small amount of leukocytes, rest negative. Will send urine for culture and further recommendation will be given accordingly. For now recommend avoiding trigger factors, drinking caffeinated drinks (sodas and tea), continue adequate fluid intake (ideally water). Monitor for new symptoms.  -     POCT Urinalysis Dipstick (Automated) -     Urine Culture  Screen for STD (sexually transmitted disease) Educated about STD prevention. She is not interested in HIV or syphilis screening. Further recommendation will be given according to urine cytology result.  -     Urine cytology ancillary only  Return if symptoms worsen or fail to improve, for keep next appointment.  Ellene Bloodsaw G. [place], MD  [facility]. [place] office.

## 2022-07-22 ENCOUNTER — Ambulatory Visit: Payer: 59 | Admitting: Family Medicine

## 2022-07-22 ENCOUNTER — Other Ambulatory Visit (HOSPITAL_COMMUNITY)
Admission: RE | Admit: 2022-07-22 | Discharge: 2022-07-22 | Disposition: A | Discharge status: Home / Self Care | Payer: 59 | Source: Ambulatory Visit | Attending: Family Medicine | Admitting: Family Medicine

## 2022-07-22 ENCOUNTER — Encounter: Payer: Self-pay | Admitting: Family Medicine

## 2022-07-22 VITALS — BP 110/70 | HR 67 | Temp 98.7°F | Resp 12 | Ht 66.0 in | Wt 152.1 lb

## 2022-07-22 DIAGNOSIS — Z113 Encounter for screening for infections with a predominantly sexual mode of transmission: Secondary | ICD-10-CM | POA: Diagnosis not present

## 2022-07-22 DIAGNOSIS — R8290 Unspecified abnormal findings in urine: Secondary | ICD-10-CM

## 2022-07-22 LAB — POC URINALSYSI DIPSTICK (AUTOMATED)
Bilirubin, UA: NEGATIVE
Blood, UA: POSITIVE
Glucose, UA: NEGATIVE
Ketones, UA: NEGATIVE
Nitrite, UA: NEGATIVE
Protein, UA: NEGATIVE
Spec Grav, UA: 1.02 (ref 1.010–1.025)
Urobilinogen, UA: 0.2 E.U./dL
pH, UA: 6 (ref 5.0–8.0)

## 2022-07-22 NOTE — Patient Instructions (Addendum)
A few things to remember from today's visit:  Foul smelling urine - Plan: POCT Urinalysis Dipstick (Automated), Urine Culture  Screen for STD (sexually transmitted disease) - Plan: Urine cytology ancillary only Increase fluid intake, ideally water, avoid sodas and tea.  Do not use My Chart to request refills or for acute issues that need immediate attention. If you send a my chart message, it may take a few days to be addressed, specially if I am not in the office.  Please be sure medication list is accurate. If a new problem present, please set up appointment sooner than planned today.

## 2022-07-23 LAB — URINE CULTURE
MICRO NUMBER:: 15084385
SPECIMEN QUALITY:: ADEQUATE

## 2022-07-25 LAB — URINE CYTOLOGY ANCILLARY ONLY
Chlamydia: NEGATIVE
Comment: NEGATIVE
Comment: NEGATIVE
Comment: NORMAL
Neisseria Gonorrhea: NEGATIVE
Trichomonas: NEGATIVE

## 2022-09-07 ENCOUNTER — Encounter (INDEPENDENT_AMBULATORY_CARE_PROVIDER_SITE_OTHER): Payer: Self-pay

## 2022-09-27 ENCOUNTER — Ambulatory Visit: Payer: 59

## 2022-09-30 NOTE — Progress Notes (Deleted)
   ACUTE VISIT No chief complaint on file.  HPI: [name] is a 37 y.o. female, who is here today complaining of *** HPI  Review of Systems See other pertinent positives and negatives in HPI.  Current Outpatient Medications on File Prior to Visit  Medication Sig Dispense Refill   medroxyPROGESTERone (DEPO-PROVERA) 150 MG/ML injection Inject 1 mL (150 mg total) into the muscle every 3 (three) months. 1 mL 0   Current Facility-Administered Medications on File Prior to Visit  Medication Dose Route Frequency Provider Last Rate Last Admin   medroxyPROGESTERone (DEPO-PROVERA) injection 150 mg  150 mg Intramuscular Q90 days [place], Betty G, MD   150 mg at 04/12/22 1610    Past Medical History:  Diagnosis Date   Abnormal Pap smear 2008   Chlamydia contact, treated 01/11/2011   Treated/ neg TOC   Chlamydia contact, treated 01/2011   Treated/ neg TOC   Chlamydia infection 2006   Late prenatal care complicating pregnancy    No pertinent past medical history    Temporary low platelet count (HCC)    at 28 weeks   Allergies  Allergen Reactions   Other     Social History   Socioeconomic History   Marital status: Single    Spouse name: Not on file   Number of children: Not on file   Years of education: Not on file   Highest education level: Not on file  Occupational History   Not on file  Tobacco Use   Smoking status: Never   Smokeless tobacco: Never  Substance and Sexual Activity   Alcohol use: No   Drug use: No   Sexual activity: Yes    Partners: Male    Birth control/protection: None  Other Topics Concern   Not on file  Social History Narrative   Not on file   Social Determinants of Health   Financial Resource Strain: Not on file  Food Insecurity: Not on file  Transportation Needs: Not on file  Physical Activity: Not on file  Stress: Not on file  Social Connections: Not on file    There were no vitals filed for this visit. There is no  height or weight on file to calculate BMI.  Physical Exam  ASSESSMENT AND PLAN: There are no diagnoses linked to this encounter.  No follow-ups on file.  Betty G. [place], MD  [facility]. [place] office.  Discharge Instructions   None

## 2022-10-03 ENCOUNTER — Ambulatory Visit (INDEPENDENT_AMBULATORY_CARE_PROVIDER_SITE_OTHER): Payer: 59

## 2022-10-03 ENCOUNTER — Ambulatory Visit: Payer: 59 | Admitting: Family Medicine

## 2022-10-03 DIAGNOSIS — Z3042 Encounter for surveillance of injectable contraceptive: Secondary | ICD-10-CM | POA: Diagnosis not present

## 2022-10-03 MED ORDER — MEDROXYPROGESTERONE ACETATE 150 MG/ML IM SUSY
150.0000 mg | PREFILLED_SYRINGE | INTRAMUSCULAR | Status: AC
Start: 2022-10-03 — End: 2022-10-03
  Administered 2022-10-03: 150 mg via INTRAMUSCULAR

## 2022-10-03 NOTE — Progress Notes (Signed)
Per orders of Dr. [place], injection of MedroxyPROGESTERone Acetate given by Vickii Chafe on Left Deltoid. . Patient tolerated injection well.

## 2022-12-19 ENCOUNTER — Ambulatory Visit (INDEPENDENT_AMBULATORY_CARE_PROVIDER_SITE_OTHER): Payer: 59

## 2022-12-19 DIAGNOSIS — Z3042 Encounter for surveillance of injectable contraceptive: Secondary | ICD-10-CM | POA: Diagnosis not present

## 2022-12-19 MED ORDER — MEDROXYPROGESTERONE ACETATE 150 MG/ML IM SUSP
150.0000 mg | INTRAMUSCULAR | Status: AC
Start: 2022-12-19 — End: ?
  Administered 2022-12-19: 150 mg via INTRAMUSCULAR

## 2022-12-19 NOTE — Progress Notes (Signed)
Per orders of Dr. [place], injection of Depo Provera given by Rodrigo Ran. Patient tolerated injection well.

## 2023-03-06 ENCOUNTER — Ambulatory Visit (INDEPENDENT_AMBULATORY_CARE_PROVIDER_SITE_OTHER): Payer: 59

## 2023-03-06 DIAGNOSIS — Z3042 Encounter for surveillance of injectable contraceptive: Secondary | ICD-10-CM

## 2023-03-06 MED ORDER — MEDROXYPROGESTERONE ACETATE 150 MG/ML IM SUSY
150.0000 mg | PREFILLED_SYRINGE | INTRAMUSCULAR | Status: AC
Start: 2023-03-06 — End: 2023-03-06
  Administered 2023-03-06: 150 mg via INTRAMUSCULAR

## 2023-03-06 NOTE — Progress Notes (Signed)
Per orders of Dr. [place], injection of Depo-provera given by Vickii Chafe on Right Deltoid. Patient tolerated injection well.

## 2023-05-22 ENCOUNTER — Ambulatory Visit (INDEPENDENT_AMBULATORY_CARE_PROVIDER_SITE_OTHER): Payer: 59

## 2023-05-22 DIAGNOSIS — Z3042 Encounter for surveillance of injectable contraceptive: Secondary | ICD-10-CM

## 2023-05-22 MED ORDER — MEDROXYPROGESTERONE ACETATE 150 MG/ML IM SUSY
PREFILLED_SYRINGE | Freq: Once | INTRAMUSCULAR | Status: AC
Start: 2023-05-22 — End: 2023-05-22
  Administered 2023-05-22: 150 mg via INTRAMUSCULAR

## 2023-05-22 NOTE — Progress Notes (Signed)
 Patient is in office today for a nurse visit for Birth Control Injection-Depo-Provera 150mg . Patient Injection was given in the  Left deltoid. Patient tolerated injection well.

## 2023-08-07 ENCOUNTER — Ambulatory Visit (INDEPENDENT_AMBULATORY_CARE_PROVIDER_SITE_OTHER)

## 2023-08-07 DIAGNOSIS — Z3042 Encounter for surveillance of injectable contraceptive: Secondary | ICD-10-CM | POA: Diagnosis not present

## 2023-08-07 MED ORDER — MEDROXYPROGESTERONE ACETATE 150 MG/ML IM SUSP
150.0000 mg | INTRAMUSCULAR | Status: AC
Start: 2023-08-07 — End: ?
  Administered 2023-08-07: 150 mg via INTRAMUSCULAR

## 2023-08-07 NOTE — Progress Notes (Signed)
 Patient is in office today for a nurse visit for Birth Control Injection. Patient Injection was given in the  Left deltoid. Patient tolerated injection well.

## 2023-10-23 ENCOUNTER — Ambulatory Visit

## 2023-10-23 DIAGNOSIS — Z3042 Encounter for surveillance of injectable contraceptive: Secondary | ICD-10-CM

## 2023-10-23 MED ORDER — MEDROXYPROGESTERONE ACETATE 150 MG/ML IM SUSP
150.0000 mg | INTRAMUSCULAR | Status: AC
  Administered 2023-10-23: 150 mg via INTRAMUSCULAR

## 2023-10-23 NOTE — Progress Notes (Signed)
 Per orders of Dr. [place], injection of Medroxyprogesterone  Acetate 150 mg  given by Shishir Krantz L Michela Herst. Patient tolerated injection well.

## 2024-01-15 ENCOUNTER — Ambulatory Visit

## 2024-01-15 DIAGNOSIS — Z3042 Encounter for surveillance of injectable contraceptive: Secondary | ICD-10-CM | POA: Diagnosis not present

## 2024-01-15 MED ORDER — MEDROXYPROGESTERONE ACETATE 150 MG/ML IM SUSP
150.0000 mg | Freq: Once | INTRAMUSCULAR | Status: AC
  Administered 2024-01-15: 150 mg via INTRAMUSCULAR

## 2024-01-15 NOTE — Progress Notes (Signed)
 Patient is in office today for a nurse visit for Birth Control Injection. Patient Injection was given in the  Right deltoid. Patient tolerated injection well.   Next appointment scheduled for 04/2024

## 2024-04-22 ENCOUNTER — Ambulatory Visit
